# Patient Record
Sex: Male | Born: 1940 | Race: White | Hispanic: No | Marital: Married | Smoking: Former smoker
Health system: Southern US, Community
[De-identification: ages and names within clinical notes are randomized; demographics above are authoritative.]

## PROBLEM LIST (undated history)

## (undated) DIAGNOSIS — I714 Abdominal aortic aneurysm, without rupture, unspecified: Secondary | ICD-10-CM

## (undated) DIAGNOSIS — F418 Other specified anxiety disorders: Secondary | ICD-10-CM

## (undated) DIAGNOSIS — F32A Depression, unspecified: Secondary | ICD-10-CM

## (undated) DIAGNOSIS — C801 Malignant (primary) neoplasm, unspecified: Secondary | ICD-10-CM

## (undated) DIAGNOSIS — M1990 Unspecified osteoarthritis, unspecified site: Secondary | ICD-10-CM

## (undated) DIAGNOSIS — J40 Bronchitis, not specified as acute or chronic: Secondary | ICD-10-CM

## (undated) DIAGNOSIS — R7303 Prediabetes: Secondary | ICD-10-CM

## (undated) DIAGNOSIS — J449 Chronic obstructive pulmonary disease, unspecified: Secondary | ICD-10-CM

## (undated) DIAGNOSIS — K219 Gastro-esophageal reflux disease without esophagitis: Secondary | ICD-10-CM

## (undated) DIAGNOSIS — I509 Heart failure, unspecified: Secondary | ICD-10-CM

## (undated) DIAGNOSIS — IMO0002 Reserved for concepts with insufficient information to code with codable children: Secondary | ICD-10-CM

## (undated) DIAGNOSIS — E785 Hyperlipidemia, unspecified: Secondary | ICD-10-CM

## (undated) DIAGNOSIS — I729 Aneurysm of unspecified site: Secondary | ICD-10-CM

## (undated) DIAGNOSIS — F329 Major depressive disorder, single episode, unspecified: Secondary | ICD-10-CM

## (undated) DIAGNOSIS — I10 Essential (primary) hypertension: Secondary | ICD-10-CM

## (undated) DIAGNOSIS — K449 Diaphragmatic hernia without obstruction or gangrene: Secondary | ICD-10-CM

## (undated) DIAGNOSIS — R609 Edema, unspecified: Secondary | ICD-10-CM

## (undated) DIAGNOSIS — N400 Enlarged prostate without lower urinary tract symptoms: Secondary | ICD-10-CM

## (undated) HISTORY — DX: Benign prostatic hyperplasia without lower urinary tract symptoms: N40.0

## (undated) HISTORY — DX: Prediabetes: R73.03

## (undated) HISTORY — DX: Major depressive disorder, single episode, unspecified: F32.9

## (undated) HISTORY — DX: Chronic obstructive pulmonary disease, unspecified: J44.9

## (undated) HISTORY — PX: CORONARY STENT PLACEMENT: SHX1402

## (undated) HISTORY — DX: Depression, unspecified: F32.A

## (undated) HISTORY — DX: Gastro-esophageal reflux disease without esophagitis: K21.9

## (undated) HISTORY — DX: Aneurysm of unspecified site: I72.9

## (undated) HISTORY — DX: Essential (primary) hypertension: I10

## (undated) HISTORY — DX: Unspecified osteoarthritis, unspecified site: M19.90

## (undated) HISTORY — DX: Reserved for concepts with insufficient information to code with codable children: IMO0002

## (undated) HISTORY — DX: Bronchitis, not specified as acute or chronic: J40

## (undated) HISTORY — PX: KNEE ARTHROSCOPY: SUR90

## (undated) HISTORY — DX: Edema, unspecified: R60.9

## (undated) HISTORY — DX: Heart failure, unspecified: I50.9

## (undated) HISTORY — PX: OTHER SURGICAL HISTORY: SHX169

## (undated) HISTORY — DX: Abdominal aortic aneurysm, without rupture, unspecified: I71.40

## (undated) HISTORY — DX: Hyperlipidemia, unspecified: E78.5

## (undated) HISTORY — PX: CYST EXCISION: SHX5701

## (undated) HISTORY — DX: Abdominal aortic aneurysm, without rupture: I71.4

## (undated) HISTORY — DX: Diaphragmatic hernia without obstruction or gangrene: K44.9

## (undated) HISTORY — DX: Other specified anxiety disorders: F41.8

## (undated) HISTORY — DX: Malignant (primary) neoplasm, unspecified: C80.1

---

## 1998-03-23 ENCOUNTER — Ambulatory Visit (HOSPITAL_COMMUNITY): Admission: RE | Admit: 1998-03-23 | Discharge: 1998-03-23 | Payer: Self-pay | Admitting: Vascular Surgery

## 1998-06-20 ENCOUNTER — Ambulatory Visit (HOSPITAL_COMMUNITY)
Admission: RE | Admit: 1998-06-20 | Discharge: 1998-06-20 | Payer: Self-pay | Admitting: Thoracic Surgery (Cardiothoracic Vascular Surgery)

## 1998-06-20 ENCOUNTER — Encounter: Payer: Self-pay | Admitting: Thoracic Surgery (Cardiothoracic Vascular Surgery)

## 1999-04-24 ENCOUNTER — Encounter: Payer: Self-pay | Admitting: Vascular Surgery

## 1999-04-24 ENCOUNTER — Encounter: Admission: RE | Admit: 1999-04-24 | Discharge: 1999-04-24 | Payer: Self-pay | Admitting: Vascular Surgery

## 1999-08-16 ENCOUNTER — Encounter: Payer: Self-pay | Admitting: Internal Medicine

## 1999-08-16 ENCOUNTER — Ambulatory Visit (HOSPITAL_COMMUNITY): Admission: RE | Admit: 1999-08-16 | Discharge: 1999-08-16 | Payer: Self-pay | Admitting: Internal Medicine

## 2002-01-23 ENCOUNTER — Ambulatory Visit (HOSPITAL_COMMUNITY): Admission: RE | Admit: 2002-01-23 | Discharge: 2002-01-23 | Payer: Self-pay | Admitting: Gastroenterology

## 2002-12-31 ENCOUNTER — Ambulatory Visit (HOSPITAL_COMMUNITY): Admission: RE | Admit: 2002-12-31 | Discharge: 2002-12-31 | Payer: Self-pay | Admitting: Internal Medicine

## 2002-12-31 ENCOUNTER — Encounter: Payer: Self-pay | Admitting: Internal Medicine

## 2005-01-08 ENCOUNTER — Ambulatory Visit (HOSPITAL_COMMUNITY): Admission: RE | Admit: 2005-01-08 | Discharge: 2005-01-08 | Payer: Self-pay | Admitting: Internal Medicine

## 2005-10-10 ENCOUNTER — Encounter: Payer: Self-pay | Admitting: Vascular Surgery

## 2007-03-14 ENCOUNTER — Ambulatory Visit: Payer: Self-pay | Admitting: Vascular Surgery

## 2007-11-14 ENCOUNTER — Ambulatory Visit: Payer: Self-pay | Admitting: Vascular Surgery

## 2008-05-21 ENCOUNTER — Ambulatory Visit: Payer: Self-pay | Admitting: Vascular Surgery

## 2008-11-19 ENCOUNTER — Ambulatory Visit: Payer: Self-pay | Admitting: Vascular Surgery

## 2009-02-18 ENCOUNTER — Encounter: Admission: RE | Admit: 2009-02-18 | Discharge: 2009-02-18 | Payer: Self-pay | Admitting: Vascular Surgery

## 2009-02-18 ENCOUNTER — Ambulatory Visit: Payer: Self-pay | Admitting: Vascular Surgery

## 2010-04-12 ENCOUNTER — Ambulatory Visit: Payer: Self-pay | Admitting: Vascular Surgery

## 2010-10-17 ENCOUNTER — Ambulatory Visit: Payer: Self-pay | Admitting: Vascular Surgery

## 2010-10-24 ENCOUNTER — Ambulatory Visit: Payer: Self-pay | Admitting: Vascular Surgery

## 2010-10-24 NOTE — Procedures (Signed)
DUPLEX ULTRASOUND OF ABDOMINAL AORTA   INDICATION:  Followup abdominal aortic aneurysm.   HISTORY:  Diabetes:  No.  Cardiac:  No.  Hypertension:  Yes.  Smoking:  No.  Connective Tissue Disorder:  Family History:  Sister had AAA that ruptured.  Previous Surgery:  Intestinal surgery in February of 2008 for bowel  obstruction.   DUPLEX EXAM:         AP (cm)                   TRANSVERSE (cm)  Proximal             2.8 cm                    2.9 cm  Mid                  3.9 cm                    3.9 cm  Distal               4.9 cm                    4.7 cm  Right Iliac          1.2 cm                    1.2 cm  Left Iliac           1.1 cm                    1.1 cm   PREVIOUS:  Date:  03/14/2007  AP:  4.3  TRANSVERSE:  4.6   IMPRESSION:  Known aneurysm of the mid to distal abdominal aorta with no  significant change in the maximum diameter measurements noted when  compared to a previous exam.   ___________________________________________  Larina Earthly, M.D.   CH/MEDQ  D:  11/14/2007  T:  11/14/2007  Job:  [id number]

## 2010-10-24 NOTE — Procedures (Signed)
DUPLEX ULTRASOUND OF ABDOMINAL AORTA   INDICATION:  Follow-up evaluation of known abdominal aortic aneurysm.   HISTORY:  Diabetes:  No.  Cardiac:  No.  Hypertension:  Yes.  Smoking:  No.  Connective Tissue Disorder:  Family History:  Sister had an abdominal aortic aneurysm which ruptured.  Previous Surgery:  Intestinal surgery in February, 2008 for bowel  obstruction.   DUPLEX EXAM:         AP (cm)                   TRANSVERSE (cm)  Proximal             2.8 Cm                    3.6 cm  Mid                  4.3 cm                    4.6 cm  Distal               2.2 cm                    2.5 cm  Right Iliac          1.0. Cm                   1.0 cm  Left Iliac           0.9 cm                    1.2 cm   PREVIOUS:  Date: 02/18/2006  AP:  4.24  TRANSVERSE:  4.45   IMPRESSION:  Abdominal aortic aneurysm measurements are stable from  previous study.   ___________________________________________  Larina Earthly, M.D.   MC/MEDQ  D:  03/14/2007  T:  03/14/2007  Job:  [id number]

## 2010-10-24 NOTE — Assessment & Plan Note (Signed)
OFFICE VISIT   ERMAL, HABERER  DOB:  Jul 14, 1940                                       11/19/2008  [id number]#:[id number]   The patient presents today for continued followup of his infrarenal  abdominal aortic aneurysm.  This has been known for nearly 20 years and  has had continued small progression over time.  He has no symptoms  referable to his aneurysm.  His sister did suffer a ruptured aneurysm  which she survived emergent repair.  He has some mild diffuse abdominal  soreness unrelated to his aneurysm.  He does have COPD with no cardiac  history.   On physical exam he continues to have 2+ femoral, popliteal and  posterior tibial pulses.  Abdominal exam reveals moderate obesity and I  do not feel any aneurysm.   Ultrasound today shows slight increase in size up to 5.04 cm up from  4.62 on his prior study.  I did note that the study prior to that had  estimated the size at 4.9 so he has not had a dramatic change.  I again  had a very long discussion with the patient and his wife present and  explained that he is at the threshold for repair.  He wished to consider  this in September.  We will see him back with a CT angio to determine if  he is a stent graft candidate versus an open candidate.  We will see him  for further discussion at that time.   Larina Earthly, M.D.  Electronically Signed   TFE/MEDQ  D:  11/19/2008  T:  11/19/2008  Job:  [id number]   cc:   Jeoffrey Massed, MD

## 2010-10-24 NOTE — Assessment & Plan Note (Signed)
OFFICE VISIT   Walter Snyder, Walter Snyder  DOB:  [birth year]                                       05/21/2008  MVHQI#:[id number]   The patient presents today for continued follow-up of his abdominal  aortic aneurysm.  He has had this followed up for nearly 20 years.  He  has had continued slow growth since that time.  He has had no symptoms  referable to his aneurysm and has had no new major medical difficulties.  History with elevated cholesterol and elevated blood pressure.  He does  have a history COPD; fortunately, he has not smoked since 1994.  His  weight is currently 215 pounds.  He is 6 feet 1 inch tall.   REVIEW OF SYSTEMS:  Positive for bronchitis, wheezing, hiatal hernia,  reflux, arthritis, depression, nervousness.   ALLERGIES:  None.   MEDICATIONS:  Lisinopril, Xanax, doxazosin, Spiriva, Zantac and  multivitamins.   PHYSICAL EXAMINATION:  A well-developed, well-nourished white male  appearing his stated age 70.  Vital Signs:  Blood pressure is 116/71,  pulse 72, respirations 18.  Abdominal:  Reveals mild obesity.  I do not  feel an aneurysm.  He has 2+ femoral, 2+ popliteal and 2+ posterior  tibial pulses with no evidence of peripheral aneurysms.   He underwent repeat ultrasound today and this reveals no significant  change since his last study 6 months ago, with a maximal diameter of 4.6  cm.  He will continue his usual activities and we will continue to  follow him in our vascular lab at 44-month intervals to rule out any  enlargement.  I did explain that if he reaches a size greater than 5 cm  we would proceed with CT imaging to determine if he is a candidate for  stent graft vs. open repair.  We will see him again in 6 months.   Larina Earthly, M.D.  Electronically Signed   TFE/MEDQ  D:  05/21/2008  T:  05/24/2008  Job:  [id number]   cc:   Lucky Cowboy, M.D.

## 2010-10-24 NOTE — Procedures (Signed)
DUPLEX ULTRASOUND OF ABDOMINAL AORTA   INDICATION:  Follow up AAA.   HISTORY:  Diabetes:  No.  Cardiac:  No.  Hypertension:  Yes.  Smoking:  No.  Connective Tissue Disorder:  Family History:  No.  Previous Surgery:  No.   DUPLEX EXAM:         AP (cm)                   TRANSVERSE (cm)  Proximal             2.97 cm                   3.31 cm  Mid                  4.54 cm                   4.27 cm  Distal               4.57 cm                   5.05 cm  Right Iliac  Left Iliac   PREVIOUS:  Date: 11/19/2008  AP:  4.74  TRANSVERSE:  5.04   IMPRESSION:  1. Abdominal aortic aneurysm noted with largest measurement of 4.57 X      5.05 cm.  2. Unable to visualize iliac arteries due to bowel gas.   ___________________________________________  Larina Earthly, M.D.   EM/MEDQ  D:  04/12/2010  T:  04/12/2010  Job:  [id number]

## 2010-10-24 NOTE — Assessment & Plan Note (Signed)
OFFICE VISIT   Walter Snyder, Walter Snyder  DOB:  Oct 26, 1940                                       02/18/2009  ZOXWR#:[id number]   The patient presents today for continued follow-up of his infrarenal  abdominal aortic aneurysm.  On my last visit with him in June, he had a  slight increase of his aneurysm from ultrasound up to 5 cm.  I had  recommended that we see him today with a CT scan to determine if he is a  stent graft candidate and also for sizing of his aorta.  He has no new  symptoms referable to his aneurysm and has been stable from a cardiac  point.  I reviewed his CT scan with him.  This shows diffuse  arteriomegaly beginning in his chest and extending down through the  aorta.  Maximal diameter is approximately 5 cm, however his aortic level  of the renal arteries is about 4 cm.  I had a long discussion with the  patient and his wife, explaining that I would recommend continued  observation.  I explained that with his diffuse arteriomegaly I feel  that his risk for rupture is less than someone with a fusiform aneurysm  and a small native aorta.  We will see him in 6 months with repeat  ultrasound.  I again reviewed symptoms of leaking aneurysm with him and  he will notify us should this occur.  Otherwise, we will see him in 6  months with continued serial ultrasound follow-up.   Larina Earthly, M.D.  Electronically Signed   TFE/MEDQ  D:  02/18/2009  T:  02/21/2009  Job:  [id number]   cc:   Lucky Cowboy, M.D.

## 2010-10-24 NOTE — Procedures (Signed)
DUPLEX ULTRASOUND OF ABDOMINAL AORTA   INDICATION:  Follow up of known AAA.   HISTORY:  Diabetes:  No.  Cardiac:  No.  Hypertension:  Yes.  Smoking:  No.  Connective Tissue Disorder:  Family History:  No.  Previous Surgery:   DUPLEX EXAM:         AP (cm)                   TRANSVERSE (cm)  Proximal             3.66 cm                   3.95 cm  Mid                  3.91 cm                   4.05 cm  Distal               4.62 cm                   4.04 cm  Right Iliac          1.36 cm                   1.30 cm  Left Iliac           1.13 cm                   1.08 cm   PREVIOUS:  Date:  AP:  4.9  TRANSVERSE:  4.7   IMPRESSION:  Duplex scan shows no significant change since the last  study on 11/14/2007.   ___________________________________________  Larina Earthly, M.D.   AC/MEDQ  D:  05/21/2008  T:  05/21/2008  Job:  [id number]

## 2010-10-24 NOTE — Procedures (Signed)
DUPLEX ULTRASOUND OF ABDOMINAL AORTA   INDICATION:  Follow-up evaluation of known abdominal aortic aneurysm.   HISTORY:  Diabetes:  No.  Cardiac:  No.  Hypertension:  Yes.  Smoking:  No.  Connective Tissue Disorder:  Family History:  No.  Previous Surgery:  No.   DUPLEX EXAM:         AP (cm)                   TRANSVERSE (cm)  Proximal             3.88 cm                   4.09 cm  Mid                  3.92 cm                   4.31 cm  Distal               4.74 cm                   5.04 cm  Right Iliac          1.26 cm                   1.22 cm  Left Iliac           1.20 cm                   1.13 cm   PREVIOUS:  Date:  AP:  4.62  TRANSVERSE:  4.04   IMPRESSION:  Slight increase in abdominal aortic aneurysm since last  study done on 05/21/08.   ___________________________________________  Larina Earthly, M.D.   AC/MEDQ  D:  11/19/2008  T:  11/19/2008  Job:  [id number]

## 2010-11-14 ENCOUNTER — Encounter (INDEPENDENT_AMBULATORY_CARE_PROVIDER_SITE_OTHER): Payer: MEDICARE

## 2010-11-14 ENCOUNTER — Ambulatory Visit (INDEPENDENT_AMBULATORY_CARE_PROVIDER_SITE_OTHER): Payer: MEDICARE | Admitting: Vascular Surgery

## 2010-11-14 DIAGNOSIS — I714 Abdominal aortic aneurysm, without rupture, unspecified: Secondary | ICD-10-CM

## 2010-11-15 NOTE — Assessment & Plan Note (Signed)
OFFICE VISIT  Walter Snyder, Walter Snyder DOB:  [birth year]                                       11/14/2010 [id number]#:[id number]  The patient presents today for discussion regarding his infrarenal abdominal aortic aneurysm.  He has no symptoms referable to this.  He has maintained stable health, did have arthroscopic surgery on his left knee, which was successful, since my last visit with him.  He does have a history of COPD.  No cardiac difficulties.  He is retired.  He quit smoking in 1984.  He does have history of GE reflux.  PHYSICAL EXAMINATION:  Well-developed, well-nourished white male appearing stated age in no acute stress.  Blood pressure 120/75, pulse 81, respirations 12.  HEENT:  Normal.  Abdomen:  Soft, nontender.  He does have prominent aortic pulsation with no tenderness.  He does have prominent femoral, popliteal and posterior tibial pulses with no evidence of peripheral aneurysm.  Neurologically, he is grossly intact.  He did undergo ultrasound today showing slight enlargement of from of 5.1 to 5.2 cm since his last ultrasound 6 months ago.  I again reviewed this with the patient and recommended we continue to see him at 5-month intervals with ultrasound.  He does have diffuse arteriomegaly.  He does understand symptoms of leaking aneurysm which I reviewed with him again today and he knows to report immediately to the Emergency Department should this occur.    Larina Earthly, M.D. Electronically Signed  TFE/MEDQ  D:  11/14/2010  T:  11/15/2010  Job:  [id number]  cc:   Kirk Ruths, M.D.

## 2010-11-23 NOTE — Procedures (Unsigned)
DUPLEX ULTRASOUND OF ABDOMINAL AORTA  INDICATION:  Followup abdominal aortic aneurysm.  HISTORY: Diabetes:  No. Cardiac:  No. Hypertension:  Yes. Smoking:  Previous. Connective Tissue Disorder: Family History:  No. Previous Surgery:  No.  DUPLEX EXAM:         AP (cm)                   TRANSVERSE (cm) Proximal             3.5 cm                    4.1 cm Mid                  4.7 cm                    4.9 cm Distal               5.2 cm                    4.7 cm Right Iliac          1.5 cm                    1.8 cm Left Iliac           1.5 cm                    2.0 cm  PREVIOUS:  Date:  AP:  4.6  TRANSVERSE:  5.1  IMPRESSION:  Stable appearing abdominal aortic aneurysm within the mid distal aorta with intraluminal thrombus and moderate atherosclerosis visualized.  The larger measurement today is 5.2 x 4.9 cm.  Iliacs appear stable with moderate atherosclerosis and mild ectasia. Technically difficult study due to patient body habitus and scar tissue from previous surgery.  ___________________________________________ Larina Earthly, M.D.  OD/MEDQ  D:  11/14/2010  T:  11/14/2010  Job:  [id number]

## 2011-04-30 ENCOUNTER — Encounter: Payer: Self-pay | Admitting: Vascular Surgery

## 2011-05-22 ENCOUNTER — Ambulatory Visit: Payer: MEDICARE | Admitting: Vascular Surgery

## 2011-05-22 ENCOUNTER — Encounter (INDEPENDENT_AMBULATORY_CARE_PROVIDER_SITE_OTHER): Payer: MEDICARE | Admitting: *Deleted

## 2011-05-22 DIAGNOSIS — I714 Abdominal aortic aneurysm, without rupture: Secondary | ICD-10-CM

## 2011-06-07 ENCOUNTER — Other Ambulatory Visit: Payer: Self-pay

## 2011-06-07 DIAGNOSIS — I714 Abdominal aortic aneurysm, without rupture: Secondary | ICD-10-CM

## 2011-06-07 NOTE — Procedures (Unsigned)
DUPLEX ULTRASOUND OF ABDOMINAL AORTA  INDICATION:  Abdominal aortic aneurysm.  HISTORY: Diabetes:  No. Cardiac:  No. Hypertension:  Yes. Smoking:  Previous. Connective Tissue Disorder: Family History:  Sister had abdominal aortic aneurysm. Previous Surgery:  No.  DUPLEX EXAM:         AP (cm)                   TRANSVERSE (cm) Proximal             3.8 cm                    3.9 cm Mid                  4.2 cm                    4.0 cm Distal               5.4 cm                    5.2 cm Right Iliac          1.1 cm                    1.3 cm Left Iliac           1.1 cm                    1.3 cm  PREVIOUS:  Date: 11/14/2010  AP:  5.2  TRANSVERSE:  4.7  IMPRESSION: 1. Aneurysmal dilatation of the abdominal aorta with no significant     increase in maximum diameter when compared to the previous     examination. 2. Limited visualization of the abdominal aorta due to acoustic     shadowing and patient body habitus.  ___________________________________________ Larina Earthly, M.D.  CH/MEDQ  D:  05/22/2011  T:  05/22/2011  Job:  [id number]

## 2011-06-08 ENCOUNTER — Encounter: Payer: Self-pay | Admitting: Vascular Surgery

## 2011-11-26 ENCOUNTER — Encounter: Payer: Self-pay | Admitting: Neurosurgery

## 2011-11-27 ENCOUNTER — Encounter: Payer: Self-pay | Admitting: Neurosurgery

## 2011-11-27 ENCOUNTER — Ambulatory Visit (INDEPENDENT_AMBULATORY_CARE_PROVIDER_SITE_OTHER): Payer: 59 | Admitting: Neurosurgery

## 2011-11-27 ENCOUNTER — Encounter (INDEPENDENT_AMBULATORY_CARE_PROVIDER_SITE_OTHER): Payer: MEDICARE

## 2011-11-27 VITALS — BP 116/78 | HR 65 | Resp 18 | Ht 73.0 in | Wt 209.1 lb

## 2011-11-27 DIAGNOSIS — I714 Abdominal aortic aneurysm, without rupture: Secondary | ICD-10-CM

## 2011-11-27 NOTE — Progress Notes (Signed)
[facility] [place] HISTORY AND PHYSICAL   CC: Annual carotid duplex for known AAA Referring Physician: Early  History of Present Illness: 71 year old male patient of Dr. Arbie Cookey followed for a known AAA. The patient denies any abdominal or back pain. Patient has no lower extremity pain or claudication symptoms. The patient denies any new medical diagnoses or recent surgeries.  Past Medical History  Diagnosis Date  . COPD (chronic obstructive pulmonary disease)   . GERD (gastroesophageal reflux disease)   . Arthritis   . Hyperlipidemia   . Hypertension   . Bronchitis   . Hiatal hernia   . Depression with anxiety   . AAA (abdominal aortic aneurysm)   . Asthma   . DDD (degenerative disc disease)     ROS: [x]  Positive   [ ]  Denies    General: [ ]  Weight loss, [ ]  Fever, [ ]  chills Neurologic: [ ]  Dizziness, [ ]  Blackouts, [ ]  Seizure [ ]  Stroke, [ ]  "Mini stroke", [ ]  Slurred speech, [ ]  Temporary blindness; [ ]  weakness in arms or legs, [ ]  Hoarseness Cardiac: [ ]  Chest pain/pressure, [ ]  Shortness of breath at rest [ ]  Shortness of breath with exertion, [ ]  Atrial fibrillation or irregular heartbeat Vascular: [ ]  Pain in legs with walking, [ ]  Pain in legs at rest, [ ]  Pain in legs at night,  [ ]  Non-healing ulcer, [ ]  Blood clot in vein/DVT,   Pulmonary: [ ]  Home oxygen, [ ]  Productive cough, [ ]  Coughing up blood, [ ]  Asthma,  [ ]  Wheezing Musculoskeletal:  [ ]  Arthritis, [ ]  Low back pain, [ ]  Joint pain Hematologic: [ ]  Easy Bruising, [ ]  Anemia; [ ]  Hepatitis Gastrointestinal: [ ]  Blood in stool, [ ]  Gastroesophageal Reflux/heartburn, [ ]  Trouble swallowing Urinary: [ ]  chronic Kidney disease, [ ]  on HD - [ ]  MWF or [ ]  TTHS, [ ]  Burning with urination, [ ]  Difficulty urinating Skin: [ ]  Rashes, [ ]  Wounds Psychological: [ ]  Anxiety, [ ]  Depression   Social History History  Substance Use Topics  . Smoking status: Former Smoker    Types: Cigarettes      Quit date: 06/11/1982  . Smokeless tobacco: Not on file  . Alcohol Use: No    Family History Family History  Problem Relation Age of Onset  . Other Sister     AAA    No Known Allergies  Current Outpatient Prescriptions  Medication Sig Dispense Refill  . ALPRAZolam (XANAX) 1 MG tablet Take 1 mg by mouth at bedtime as needed.        . citalopram (CELEXA) 20 MG tablet Take 20 mg by mouth daily.        Marland Kitchen lisinopril-hydrochlorothiazide (PRINZIDE,ZESTORETIC) 20-12.5 MG per tablet Take 1 tablet by mouth daily.        . Multiple Vitamin (MULTIVITAMIN) capsule Take 1 capsule by mouth daily.        . Ranitidine HCl (ZANTAC PO) Take by mouth as needed.        . tiotropium (SPIRIVA) 18 MCG inhalation capsule Place 18 mcg into inhaler and inhale daily.          Physical Examination  Filed Vitals:   11/27/11 1044  BP: 116/78  Pulse:   Resp:     Body mass index is 27.59 kg/(m^2).  General:  WDWN in NAD Gait: Normal HEENT: WNL Eyes: Pupils equal Pulmonary: normal non-labored breathing , without Rales, rhonchi,  wheezing Cardiac: RRR, without  Murmurs, rubs or gallops; No carotid bruits Abdomen: soft, NT, no masses Skin: no rashes, ulcers noted Vascular Exam/Pulses: Palpable abdominal mass, femoral palpable pulses are palpable  Extremities without ischemic changes, no Gangrene , no cellulitis; no open wounds;  Musculoskeletal: no muscle wasting or atrophy  Neurologic: A&O X 3; Appropriate Affect ; SENSATION: normal; MOTOR FUNCTION:  moving all extremities equally. Speech is fluent/normal  Non-Invasive Vascular Imaging: AAA sac size today is 5.35 which is a very slight increase from previous however visualization is difficult do to body habitus and ventral hernia.  ASSESSMENT/PLAN: Asymptomatic known AAA , I discussed the measurements today with Dr. Arbie Cookey who states the patient should have a followup CT of the chest abdomen and pelvis in 6 months and be seen in his  clinic.  Lauree Chandler ANP  Clinic M.D.: Early

## 2011-11-29 NOTE — Procedures (Unsigned)
DUPLEX ULTRASOUND OF ABDOMINAL AORTA  INDICATION:  Evaluation of known abdominal aortic aneurysm.  HISTORY: Diabetes:  No. Cardiac:  No. Hypertension:  Yes. Smoking:  Previous. Connective Tissue Disorder: Family History:  Sister with ruptured abdominal aortic aneurysm. Previous Surgery:  DUPLEX EXAM:         AP (cm)                   TRANSVERSE (cm) Proximal             4.0 cm                    3.31 cm Mid                  3.97 cm                   4.01 cm Distal               5.06 cm                   5.35 cm Right Iliac          1.35 cm                   1.4 cm Left Iliac           1.34 cm                   1.34 cm  PREVIOUS:  Date: 05/22/1011  AP:  5.4  TRANSVERSE:  5.2  IMPRESSION: 1. Abdominal aortic aneurysm measuring 5.06 cm AP x 5.35 cm     transverse.  This represents stable measurements when compared with     the previous exam performed on 05/22/2011. 2. Please note that this is a technically difficult exam secondary to     the patient's body habitus and the presence of a ventral hernia.  ___________________________________________ Larina Earthly, M.D.  CI/MEDQ  D:  11/27/2011  T:  11/27/2011  Job:  [id number]

## 2011-12-28 ENCOUNTER — Encounter: Payer: Self-pay | Admitting: Gastroenterology

## 2012-02-04 ENCOUNTER — Other Ambulatory Visit: Payer: Self-pay | Admitting: *Deleted

## 2012-02-04 DIAGNOSIS — I716 Thoracoabdominal aortic aneurysm, without rupture: Secondary | ICD-10-CM

## 2012-02-18 ENCOUNTER — Other Ambulatory Visit: Payer: Self-pay | Admitting: *Deleted

## 2012-02-21 ENCOUNTER — Other Ambulatory Visit: Payer: Self-pay | Admitting: *Deleted

## 2012-02-21 DIAGNOSIS — I716 Thoracoabdominal aortic aneurysm, without rupture: Secondary | ICD-10-CM

## 2012-05-22 ENCOUNTER — Other Ambulatory Visit: Payer: Self-pay | Admitting: Vascular Surgery

## 2012-05-26 ENCOUNTER — Encounter: Payer: Self-pay | Admitting: Vascular Surgery

## 2012-05-27 ENCOUNTER — Ambulatory Visit
Admission: RE | Admit: 2012-05-27 | Discharge: 2012-05-27 | Disposition: A | Discharge status: Home / Self Care | Payer: MEDICARE | Source: Ambulatory Visit | Attending: Vascular Surgery | Admitting: Vascular Surgery

## 2012-05-27 ENCOUNTER — Ambulatory Visit (INDEPENDENT_AMBULATORY_CARE_PROVIDER_SITE_OTHER): Payer: MEDICARE | Admitting: Vascular Surgery

## 2012-05-27 ENCOUNTER — Ambulatory Visit
Admission: RE | Admit: 2012-05-27 | Discharge: 2012-05-27 | Disposition: A | Discharge status: Home / Self Care | Payer: MEDICARE | Source: Ambulatory Visit | Attending: Neurosurgery | Admitting: Neurosurgery

## 2012-05-27 ENCOUNTER — Encounter: Payer: Self-pay | Admitting: Vascular Surgery

## 2012-05-27 VITALS — BP 142/74 | HR 68 | Resp 16 | Ht 73.0 in | Wt 209.0 lb

## 2012-05-27 DIAGNOSIS — I714 Abdominal aortic aneurysm, without rupture: Secondary | ICD-10-CM

## 2012-05-27 DIAGNOSIS — I716 Thoracoabdominal aortic aneurysm, without rupture: Secondary | ICD-10-CM

## 2012-05-27 MED ORDER — IOHEXOL 350 MG/ML SOLN
100.0000 mL | Freq: Once | INTRAVENOUS | Status: AC | PRN
  Administered 2012-05-27: 100 mL via INTRAVENOUS

## 2012-05-27 NOTE — Progress Notes (Signed)
The patient has today for continued followup of his known infrarenal abdominal aortic aneurysm. His most recent ultrasound suggested some increase in size and he is here today at 6 month interval with a CT scan for further evaluation. He remained stable and has had no symptoms related to his aneurysm. He did have prior emergent abdominal surgery in the past for bowel obstruction and does have a ventral hernia which is not incarcerated and not causing him any pain.  Past Medical History  Diagnosis Date  . COPD (chronic obstructive pulmonary disease)   . GERD (gastroesophageal reflux disease)   . Arthritis   . Hyperlipidemia   . Hypertension   . Bronchitis   . Hiatal hernia   . Depression with anxiety   . AAA (abdominal aortic aneurysm)   . Asthma   . DDD (degenerative disc disease)     History  Substance Use Topics  . Smoking status: Former Smoker    Types: Cigarettes    Quit date: 06/11/1982  . Smokeless tobacco: Never Used  . Alcohol Use: No    Family History  Problem Relation Age of Onset  . Other Sister     AAA    No Known Allergies  Current outpatient prescriptions:ALPRAZolam (XANAX) 1 MG tablet, Take 1 mg by mouth at bedtime as needed.  , Disp: , Rfl: ;  citalopram (CELEXA) 20 MG tablet, Take 20 mg by mouth daily.  , Disp: , Rfl: ;  doxazosin (CARDURA) 8 MG tablet, Take 1 tablet by mouth daily. , Disp: , Rfl: ;  lisinopril-hydrochlorothiazide (PRINZIDE,ZESTORETIC) 20-25 MG per tablet, Take 1 tablet by mouth daily., Disp: , Rfl:  Multiple Vitamin (MULTIVITAMIN) capsule, Take 1 capsule by mouth daily.  , Disp: , Rfl: ;  Ranitidine HCl (ZANTAC PO), Take by mouth as needed.  , Disp: , Rfl: ;  tiotropium (SPIRIVA) 18 MCG inhalation capsule, Place 18 mcg into inhaler and inhale daily.  , Disp: , Rfl: ;  lisinopril-hydrochlorothiazide (PRINZIDE,ZESTORETIC) 20-12.5 MG per tablet, Take 1 tablet by mouth daily.  , Disp: , Rfl:   BP 142/74  Pulse 68  Resp 16  Ht 6\' 1"  (1.854 m)  Wt  209 lb (94.802 kg)  BMI 27.57 kg/m2  SpO2 96%  Body mass index is 27.57 kg/(m^2).       Physical exam well-developed well-nourished gentleman appearing stated age with no acute distress Carotid arteries without bruits bilaterally Heart regular rate and rhythm without murmur  Abdomen soft nontender no tenderness over his palpable aneurysm. Pulse status 2+ femoral pulses bilaterally Neurologically is grossly intact  CT scan today shows diffuse arteriomegaly. There is been no significant increase with maximal diameter of his aneurysm at approximately 5.1 cm. I discussed this with the patient and recommended continued 6 months interval evaluation with ultrasound. I again discussed symptoms of leaking aneurysm in his report he should this occur. Otherwise we'll see him in 6 months with repeat ultrasound evaluation

## 2012-05-28 NOTE — Addendum Note (Signed)
Addended by: Sharee Pimple on: 05/28/2012 09:13 AM   Modules accepted: Orders

## 2012-08-28 ENCOUNTER — Other Ambulatory Visit (HOSPITAL_COMMUNITY): Payer: Self-pay | Admitting: Internal Medicine

## 2012-08-28 ENCOUNTER — Ambulatory Visit (HOSPITAL_COMMUNITY)
Admission: RE | Admit: 2012-08-28 | Discharge: 2012-08-28 | Disposition: A | Discharge status: Home / Self Care | Payer: MEDICARE | Source: Ambulatory Visit | Attending: Internal Medicine | Admitting: Internal Medicine

## 2012-08-28 DIAGNOSIS — J4489 Other specified chronic obstructive pulmonary disease: Secondary | ICD-10-CM | POA: Insufficient documentation

## 2012-08-28 DIAGNOSIS — R0602 Shortness of breath: Secondary | ICD-10-CM | POA: Insufficient documentation

## 2012-08-28 DIAGNOSIS — I700 Atherosclerosis of aorta: Secondary | ICD-10-CM | POA: Insufficient documentation

## 2012-09-25 ENCOUNTER — Encounter: Payer: Self-pay | Admitting: Gastroenterology

## 2012-10-01 ENCOUNTER — Other Ambulatory Visit: Payer: MEDICARE

## 2012-10-01 ENCOUNTER — Other Ambulatory Visit: Payer: Self-pay | Admitting: Internal Medicine

## 2012-10-01 DIAGNOSIS — R0602 Shortness of breath: Secondary | ICD-10-CM

## 2012-10-01 DIAGNOSIS — R7989 Other specified abnormal findings of blood chemistry: Secondary | ICD-10-CM

## 2012-11-25 ENCOUNTER — Encounter (INDEPENDENT_AMBULATORY_CARE_PROVIDER_SITE_OTHER): Payer: MEDICARE | Admitting: *Deleted

## 2012-11-25 ENCOUNTER — Ambulatory Visit: Payer: MEDICARE | Admitting: Neurosurgery

## 2012-11-25 DIAGNOSIS — I714 Abdominal aortic aneurysm, without rupture: Secondary | ICD-10-CM

## 2012-11-27 ENCOUNTER — Other Ambulatory Visit: Payer: Self-pay

## 2012-11-27 DIAGNOSIS — I714 Abdominal aortic aneurysm, without rupture: Secondary | ICD-10-CM

## 2012-12-01 ENCOUNTER — Encounter: Payer: Self-pay | Admitting: Vascular Surgery

## 2013-05-22 DIAGNOSIS — C801 Malignant (primary) neoplasm, unspecified: Secondary | ICD-10-CM

## 2013-05-22 HISTORY — PX: OTHER SURGICAL HISTORY: SHX169

## 2013-05-22 HISTORY — DX: Malignant (primary) neoplasm, unspecified: C80.1

## 2013-05-26 ENCOUNTER — Encounter: Payer: Self-pay | Admitting: Internal Medicine

## 2013-05-26 DIAGNOSIS — E785 Hyperlipidemia, unspecified: Secondary | ICD-10-CM | POA: Insufficient documentation

## 2013-05-26 DIAGNOSIS — K219 Gastro-esophageal reflux disease without esophagitis: Secondary | ICD-10-CM | POA: Insufficient documentation

## 2013-05-26 DIAGNOSIS — J449 Chronic obstructive pulmonary disease, unspecified: Secondary | ICD-10-CM | POA: Insufficient documentation

## 2013-05-26 DIAGNOSIS — N400 Enlarged prostate without lower urinary tract symptoms: Secondary | ICD-10-CM | POA: Insufficient documentation

## 2013-05-26 DIAGNOSIS — I10 Essential (primary) hypertension: Secondary | ICD-10-CM | POA: Insufficient documentation

## 2013-05-26 DIAGNOSIS — R7303 Prediabetes: Secondary | ICD-10-CM | POA: Insufficient documentation

## 2013-05-26 DIAGNOSIS — F329 Major depressive disorder, single episode, unspecified: Secondary | ICD-10-CM | POA: Insufficient documentation

## 2013-05-26 DIAGNOSIS — F32A Depression, unspecified: Secondary | ICD-10-CM | POA: Insufficient documentation

## 2013-05-27 ENCOUNTER — Ambulatory Visit: Payer: Self-pay | Admitting: Physician Assistant

## 2013-06-01 ENCOUNTER — Encounter: Payer: Self-pay | Admitting: Family

## 2013-06-02 ENCOUNTER — Ambulatory Visit: Payer: MEDICARE | Admitting: Physician Assistant

## 2013-06-02 ENCOUNTER — Ambulatory Visit: Payer: MEDICARE | Admitting: Family

## 2013-06-02 ENCOUNTER — Encounter (HOSPITAL_COMMUNITY): Payer: MEDICARE

## 2013-06-02 ENCOUNTER — Other Ambulatory Visit (HOSPITAL_COMMUNITY): Payer: MEDICARE

## 2013-06-23 ENCOUNTER — Encounter: Payer: Self-pay | Admitting: Vascular Surgery

## 2013-06-24 ENCOUNTER — Ambulatory Visit (INDEPENDENT_AMBULATORY_CARE_PROVIDER_SITE_OTHER): Payer: MEDICARE | Admitting: Physician Assistant

## 2013-06-24 ENCOUNTER — Ambulatory Visit (HOSPITAL_COMMUNITY)
Admission: RE | Admit: 2013-06-24 | Discharge: 2013-06-24 | Disposition: A | Discharge status: Home / Self Care | Payer: MEDICARE | Source: Ambulatory Visit | Attending: Family | Admitting: Family

## 2013-06-24 ENCOUNTER — Ambulatory Visit (INDEPENDENT_AMBULATORY_CARE_PROVIDER_SITE_OTHER): Payer: MEDICARE | Admitting: Family

## 2013-06-24 ENCOUNTER — Encounter: Payer: Self-pay | Admitting: Physician Assistant

## 2013-06-24 ENCOUNTER — Encounter (INDEPENDENT_AMBULATORY_CARE_PROVIDER_SITE_OTHER): Payer: Self-pay

## 2013-06-24 ENCOUNTER — Encounter: Payer: Self-pay | Admitting: Family

## 2013-06-24 VITALS — BP 132/80 | HR 84 | Temp 97.9°F | Resp 16 | Ht 73.0 in | Wt 222.0 lb

## 2013-06-24 VITALS — BP 122/77 | HR 72 | Resp 16 | Ht 73.0 in | Wt 220.0 lb

## 2013-06-24 DIAGNOSIS — R059 Cough, unspecified: Secondary | ICD-10-CM

## 2013-06-24 DIAGNOSIS — I10 Essential (primary) hypertension: Secondary | ICD-10-CM

## 2013-06-24 DIAGNOSIS — I714 Abdominal aortic aneurysm, without rupture, unspecified: Secondary | ICD-10-CM

## 2013-06-24 DIAGNOSIS — E785 Hyperlipidemia, unspecified: Secondary | ICD-10-CM

## 2013-06-24 DIAGNOSIS — R7303 Prediabetes: Secondary | ICD-10-CM

## 2013-06-24 DIAGNOSIS — R05 Cough: Secondary | ICD-10-CM

## 2013-06-24 DIAGNOSIS — R7309 Other abnormal glucose: Secondary | ICD-10-CM

## 2013-06-24 DIAGNOSIS — Z79899 Other long term (current) drug therapy: Secondary | ICD-10-CM

## 2013-06-24 DIAGNOSIS — R058 Other specified cough: Secondary | ICD-10-CM

## 2013-06-24 LAB — BASIC METABOLIC PANEL WITH GFR
BUN: 18 mg/dL (ref 6–23)
CALCIUM: 8.4 mg/dL (ref 8.4–10.5)
CO2: 28 mEq/L (ref 19–32)
Chloride: 99 mEq/L (ref 96–112)
Creat: 0.98 mg/dL (ref 0.50–1.35)
GFR, EST AFRICAN AMERICAN: 88 mL/min
GFR, EST NON AFRICAN AMERICAN: 76 mL/min
Glucose, Bld: 89 mg/dL (ref 70–99)
POTASSIUM: 4.2 meq/L (ref 3.5–5.3)
SODIUM: 138 meq/L (ref 135–145)

## 2013-06-24 LAB — LIPID PANEL
CHOLESTEROL: 171 mg/dL (ref 0–200)
HDL: 34 mg/dL — AB (ref >39)
LDL CALC: 115 mg/dL — AB (ref 0–99)
TRIGLYCERIDES: 112 mg/dL (ref <150)
Total CHOL/HDL Ratio: 5 Ratio
VLDL: 22 mg/dL (ref 0–40)

## 2013-06-24 LAB — CBC WITH DIFFERENTIAL/PLATELET
BASOS ABS: 0 10*3/uL (ref 0.0–0.1)
BASOS PCT: 0 % (ref 0–1)
EOS ABS: 0.2 10*3/uL (ref 0.0–0.7)
Eosinophils Relative: 3 % (ref 0–5)
HCT: 39.4 % (ref 39.0–52.0)
HEMOGLOBIN: 13.5 g/dL (ref 13.0–17.0)
Lymphocytes Relative: 13 % (ref 12–46)
Lymphs Abs: 0.8 10*3/uL (ref 0.7–4.0)
MCH: 30.5 pg (ref 26.0–34.0)
MCHC: 34.3 g/dL (ref 30.0–36.0)
MCV: 89.1 fL (ref 78.0–100.0)
Monocytes Absolute: 1.1 10*3/uL — ABNORMAL HIGH (ref 0.1–1.0)
Monocytes Relative: 17 % — ABNORMAL HIGH (ref 3–12)
NEUTROS ABS: 4.1 10*3/uL (ref 1.7–7.7)
NEUTROS PCT: 67 % (ref 43–77)
PLATELETS: 213 10*3/uL (ref 150–400)
RBC: 4.42 MIL/uL (ref 4.22–5.81)
RDW: 14.2 % (ref 11.5–15.5)
WBC: 6.1 10*3/uL (ref 4.0–10.5)

## 2013-06-24 LAB — MAGNESIUM: MAGNESIUM: 1.8 mg/dL (ref 1.5–2.5)

## 2013-06-24 LAB — HEPATIC FUNCTION PANEL
ALT: 20 U/L (ref 0–53)
AST: 21 U/L (ref 0–37)
Albumin: 3.8 g/dL (ref 3.5–5.2)
Alkaline Phosphatase: 69 U/L (ref 39–117)
BILIRUBIN DIRECT: 0.1 mg/dL (ref 0.0–0.3)
BILIRUBIN INDIRECT: 0.5 mg/dL (ref 0.0–0.9)
Total Bilirubin: 0.6 mg/dL (ref 0.3–1.2)
Total Protein: 6.3 g/dL (ref 6.0–8.3)

## 2013-06-24 LAB — HEMOGLOBIN A1C
Hgb A1c MFr Bld: 6.4 % — ABNORMAL HIGH (ref <5.7)
MEAN PLASMA GLUCOSE: 137 mg/dL — AB (ref <117)

## 2013-06-24 MED ORDER — LEVOFLOXACIN 500 MG PO TABS: 500.0000 mg | ORAL_TABLET | Freq: Every day | ORAL | Status: AC

## 2013-06-24 NOTE — Patient Instructions (Signed)
 Bad carbs also include fruit juice, alcohol, and sweet tea. These are empty calories that do not signal to your brain that you are full.   Please remember the good carbs are still carbs which convert into sugar. So please measure them out no more than 1/2-1 cup of rice, oatmeal, pasta, and beans.  Veggies are however free foods! Pile them on.   I like lean protein at every meal such as chicken, turkey, pork chops, cottage cheese, etc. Just do not fry these meats and please center your meal around vegetable, the meats should be a side dish.   No all fruit is created equal. Please see the list below, the fruit at the bottom is higher in sugars than the fruit at the top   Cholesterol Cholesterol is a white, waxy, fat-like protein needed by your body in small amounts. The liver makes all the cholesterol you need. It is carried from the liver by the blood through the blood vessels. Deposits (plaque) may build up on blood vessel walls. This makes the arteries narrower and stiffer. Plaque increases the risk for heart attack and stroke. You cannot feel your cholesterol level even if it is very high. The only way to know is by a blood test to check your lipid (fats) levels. Once you know your cholesterol levels, you should keep a record of the test results. Work with your caregiver to to keep your levels in the desired range. WHAT THE RESULTS MEAN:  Total cholesterol is a rough measure of all the cholesterol in your blood.  LDL is the so-called bad cholesterol. This is the type that deposits cholesterol in the walls of the arteries. You want this level to be low.  HDL is the good cholesterol because it cleans the arteries and carries the LDL away. You want this level to be high.  Triglycerides are fat that the body can either burn for energy or store. High levels are closely linked to heart disease. DESIRED LEVELS:  Total cholesterol below 200.  LDL below 100 for people at risk, below 70 for very  high risk.  HDL above 50 is good, above 60 is best.  Triglycerides below 150. HOW TO LOWER YOUR CHOLESTEROL:  Diet.  Choose fish or white meat chicken and turkey, roasted or baked. Limit fatty cuts of red meat, fried foods, and processed meats, such as sausage and lunch meat.  Eat lots of fresh fruits and vegetables. Choose whole grains, beans, pasta, potatoes and cereals.  Use only small amounts of olive, corn or canola oils. Avoid butter, mayonnaise, shortening or palm kernel oils. Avoid foods with trans-fats.  Use skim/nonfat milk and low-fat/nonfat yogurt and cheeses. Avoid whole milk, cream, ice cream, egg yolks and cheeses. Healthy desserts include angel food cake, ginger snaps, animal crackers, hard candy, popsicles, and low-fat/nonfat frozen yogurt. Avoid pastries, cakes, pies and cookies.  Exercise.  A regular program helps decrease LDL and raises HDL.  Helps with weight control.  Do things that increase your activity level like gardening, walking, or taking the stairs.  Medication.  May be prescribed by your caregiver to help lowering cholesterol and the risk for heart disease.  You may need medicine even if your levels are normal if you have several risk factors. HOME CARE INSTRUCTIONS   Follow your diet and exercise programs as suggested by your caregiver.  Take medications as directed.  Have blood work done when your caregiver feels it is necessary. MAKE SURE YOU:   Understand   these instructions.  Will watch your condition.  Will get help right away if you are not doing well or get worse. Document Released: 02/20/2001 Document Revised: 08/20/2011 Document Reviewed: 03/11/2013 [facility] Patient Information 2014 [place], [place]. DASH Diet The DASH diet stands for "Dietary Approaches to Stop Hypertension." It is a healthy eating plan that has been shown to reduce high blood pressure (hypertension) in as little as 14 days, while also possibly providing other  significant health benefits. These other health benefits include reducing the risk of breast cancer after menopause and reducing the risk of type 2 diabetes, heart disease, colon cancer, and stroke. Health benefits also include weight loss and slowing kidney failure in patients with chronic kidney disease.  DIET GUIDELINES  Limit salt (sodium). Your diet should contain less than 1500 mg of sodium daily.  Limit refined or processed carbohydrates. Your diet should include mostly whole grains. Desserts and added sugars should be used sparingly.  Include small amounts of heart-healthy fats. These types of fats include nuts, oils, and tub margarine. Limit saturated and trans fats. These fats have been shown to be harmful in the body. CHOOSING FOODS  The following food groups are based on a 2000 calorie diet. See your Registered Dietitian for individual calorie needs. Grains and Grain Products (6 to 8 servings daily)  Eat More Often: Whole-wheat bread, brown rice, whole-grain or wheat pasta, quinoa, popcorn without added fat or salt (air popped).  Eat Less Often: White bread, white pasta, white rice, cornbread. Vegetables (4 to 5 servings daily)  Eat More Often: Fresh, frozen, and canned vegetables. Vegetables may be raw, steamed, roasted, or grilled with a minimal amount of fat.  Eat Less Often/Avoid: Creamed or fried vegetables. Vegetables in a cheese sauce. Fruit (4 to 5 servings daily)  Eat More Often: All fresh, canned (in natural juice), or frozen fruits. Dried fruits without added sugar. One hundred percent fruit juice ( cup [237 mL] daily).  Eat Less Often: Dried fruits with added sugar. Canned fruit in light or heavy syrup. [employer], Fish, and Poultry (2 servings or less daily. One serving is 3 to 4 oz [85-114 g]).  Eat More Often: Ninety percent or leaner ground beef, tenderloin, sirloin. Round cuts of beef, chicken breast, [place] breast. All fish. Grill, bake, or broil your meat.  Nothing should be fried.  Eat Less Often/Avoid: Fatty cuts of meat, [place], or chicken leg, thigh, or wing. Fried cuts of meat or fish. Dairy (2 to 3 servings)  Eat More Often: Low-fat or fat-free milk, low-fat plain or light yogurt, reduced-fat or part-skim cheese.  Eat Less Often/Avoid: Milk (whole, 2%).Whole milk yogurt. Full-fat cheeses. Nuts, Seeds, and Legumes (4 to 5 servings per week)  Eat More Often: All without added salt.  Eat Less Often/Avoid: Salted nuts and seeds, canned beans with added salt. Fats and Sweets (limited)  Eat More Often: Vegetable oils, tub margarines without trans fats, sugar-free gelatin. Mayonnaise and salad dressings.  Eat Less Often/Avoid: Coconut oils, palm oils, butter, stick margarine, cream, half and half, cookies, candy, pie. FOR MORE INFORMATION The Dash Diet Eating Plan: www.dashdiet.org Document Released: 05/17/2011 Document Revised: 08/20/2011 Document Reviewed: 05/17/2011 [facility] Patient Information 2014 [place], [place].

## 2013-06-24 NOTE — Patient Instructions (Signed)

## 2013-06-24 NOTE — Addendum Note (Signed)
Addended by: Thresa Ross C on: 06/24/2013 01:21 PM   Modules accepted: Orders

## 2013-06-24 NOTE — Progress Notes (Signed)
HPI Patient presents for 3 month follow up with hypertension, hyperlipidemia, prediabetes and vitamin D. Patient's blood pressure has been controlled at home, today their BP is BP: 132/80 mmHg  Patient denies chest pain, shortness of breath, dizziness.  Patient's cholesterol is diet controlled. The cholesterol last visit was LDL 147, HDL 35, trigs 180.  The patient has been working on diet and exercise for prediabetes, and denies changes in vision, polys, and paresthesias. A1C 5.7.  He states that he has been having cough/cold symptoms, he has had a zpak and steroids one week ago without help. He has been cough, fatigue. His wife had surgery 3 days ago and he has been very stressed. He has also recently seen his Vascular doctor for his AAA and states they are getting a CTA for possible surgery. He has been under a lot of stress.  Patient is on Vitamin D supplement.   Current Medications:  Current Outpatient Prescriptions on File Prior to Visit  Medication Sig Dispense Refill  . acetaminophen (TYLENOL) 100 MG/ML solution Take 10 mg/kg by mouth every 4 (four) hours as needed for fever.      . ALPRAZolam (XANAX) 1 MG tablet Take 1 mg by mouth at bedtime as needed.        . citalopram (CELEXA) 20 MG tablet Take 20 mg by mouth daily.        Marland Kitchen doxazosin (CARDURA) 8 MG tablet Take 1 tablet by mouth daily.       . fluticasone (VERAMYST) 27.5 MCG/SPRAY nasal spray Place 2 sprays into the nose daily.      Marland Kitchen lisinopril-hydrochlorothiazide (PRINZIDE,ZESTORETIC) 20-12.5 MG per tablet Take 1 tablet by mouth daily.        Marland Kitchen lisinopril-hydrochlorothiazide (PRINZIDE,ZESTORETIC) 20-25 MG per tablet Take 1 tablet by mouth daily.      Marland Kitchen loratadine-pseudoephedrine (CLARITIN-D 12-HOUR) 5-120 MG per tablet Take 1 tablet by mouth 2 (two) times daily.      . Multiple Vitamin (MULTIVITAMIN) capsule Take 1 capsule by mouth daily.        Marland Kitchen oxyCODONE-acetaminophen (PERCOCET/ROXICET) 5-325 MG per tablet Take by mouth as  needed for severe pain (One or two per week).      . pseudoephedrine (SUDAFED) 30 MG tablet Take 30 mg by mouth every 4 (four) hours as needed for congestion.      . Ranitidine HCl (ZANTAC PO) Take by mouth as needed.        . tamsulosin (FLOMAX) 0.4 MG CAPS capsule every morning.      . tiotropium (SPIRIVA) 18 MCG inhalation capsule Place 18 mcg into inhaler and inhale daily.         No current facility-administered medications on file prior to visit.   Medical History:  Past Medical History  Diagnosis Date  . Bronchitis   . Depression with anxiety   . AAA (abdominal aortic aneurysm)   . Asthma   . Prediabetes   . Hyperlipidemia   . Hypertension   . COPD (chronic obstructive pulmonary disease)   . GERD (gastroesophageal reflux disease)   . Hiatal hernia   . Arthritis   . DDD (degenerative disc disease)   . Depression   . BPH (benign prostatic hyperplasia)   . Aneurysm   . 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  ENT: + snoring  Denies congestion, post nasal drip, sore throat, earache, dental pain, Tinnitus, Vertigo, Sinus pain, snoring.  Cardio: Denies chest pain, palpitations, irregular heartbeat, dyspnea, diaphoresis, orthopnea, PND, claudication, edema Respiratory: + cough, wheezing denies shortness of breath Gastrointestinal: Denies dysphagia, heartburn, AB pain/ cramps, N/V, diarrhea, constipation, hematemesis, melena, hematochezia,  hemorrhoids Genitourinary: Denies dysuria, frequency, urgency, nocturia, hesitancy, discharge, hematuria, flank pain Musculoskeletal: Denies myalgia, stiffness, pain, swelling and strain/sprain. Skin: Denies pruritis, rash, changing in skin lesion Neuro: Denies Weakness, tremor, incoordination, spasms, pain Psychiatric: Denies confusion, memory  loss, sensory loss Endocrine: Denies change in weight, skin, hair change, nocturia Diabetic Polys, Denies visual blurring, hyper /hypo glycemic episodes, and paresthesia, Heme/Lymph: Denies Excessive bleeding, bruising, enlarged lymph nodes  Family history- Review and unchanged Social history- Review and unchanged Physical Exam: Filed Vitals:   06/24/13 1153  BP: 132/80  Pulse: 84  Temp: 97.9 F (36.6 C)  Resp: 16   Filed Weights   06/24/13 1153  Weight: 222 lb (100.699 kg)   General Appearance: Well nourished, in no apparent distress. Eyes: PERRLA, EOMs, conjunctiva no swelling or erythema Sinuses: No Frontal/maxillary tenderness ENT/Mouth: very crowded mouth  Ext aud canals clear, TMs without erythema, bulging. No erythema, swelling, or exudate on post pharynx.  Tonsils not swollen or erythematous. Hearing decreased.  Neck: Supple, thyroid normal.  Respiratory: Respiratory effort normal, + diffuse expiratory wheezing without rales, rhonchi, wheezing or stridor.  Cardio: RRR with no MRGs. Brisk peripheral pulses without edema.  Abdomen: Soft, obese, + BS. Mild epigastric tenderness, no guarding, rebound, hernias, masses. Lymphatics: Non tender without lymphadenopathy.  Musculoskeletal: Full ROM, 5/5 strength, normal gait.  Skin: Warm, dry without rashes, lesions, ecchymosis.  Neuro: Cranial nerves intact. Normal muscle tone, no cerebellar symptoms.  Psych: Awake and oriented X 3, normal affect, Insight and Judgment appropriate.   Assessment and Plan:  Hypertension: Continue medication, monitor blood pressure at home.  Continue DASH diet. Cholesterol: Continue diet and exercise. Check cholesterol.  Pre-diabetes-Continue diet and exercise. Check A1C Vitamin D Def- check level and continue medications.  Bronchitis/ AECOPD- levaquin, OTC meds, no decongestants, albuterol and continue spiriva and advair.  Likely sleep apnea- states he wants a study after his procedure.  AAA-  long discussion about AAA, about HTN, chol and sugars. If any back pain, AB pain call 911.   Continue diet and meds as discussed. Further disposition pending results of labs.  Vicie Mutters 12:09 PM

## 2013-06-24 NOTE — Progress Notes (Addendum)
[facility] [place]  Established Abdominal Aortic Aneurysm  History of Present Illness  Walter Snyder is a 73 y.o. (Jun 11, 1941) male patient of Dr. Donnetta Hutching returns today for continued followup of his known infrarenal abdominal aortic aneurysm. He did have prior emergent abdominal surgery in the past for bowel obstruction and does have a ventral hernia which is not incarcerated.  He has degenerative spine disease that causes intermittent thoracic area pain. He reports that he has COPD, has had an exacerbation and coughing for at least 2 weeks, is taking antibiotics and prednisone, will see his PCP today for this. He denies no increase in back pain other than chronic soreness in thoracic spine area, he also reports upper mid abdominal mild soreness since he has been coughing. Previous studies demonstrate an AAA, measuring 5.2 cm on 11/25/12.   The patient denies claudication in legs with walking. The patient denies history of stroke or TIA symptoms. He is scheduled to see Dr. Lyndee Hensen (sp?), cardiologist, as requested by his PCP to see if any of his dyspnea is cardiac related. Pt states that he had a CT angiogram a year or so ago and Dr. Donnetta Hutching told him he is not a candidate for an EVAR.  Pt Diabetic: No Pt smoker: former smoker, quit in 1984  Past Medical History  Diagnosis Date  . Bronchitis   . Depression with anxiety   . AAA (abdominal aortic aneurysm)   . Asthma   . Prediabetes   . Hyperlipidemia   . Hypertension   . COPD (chronic obstructive pulmonary disease)   . GERD (gastroesophageal reflux disease)   . Hiatal hernia   . Arthritis   . DDD (degenerative disc disease)   . Depression   . BPH (benign prostatic hyperplasia)   . Aneurysm    Past Surgical History  Procedure Laterality Date  . Knee arthroscopy      left  . Cyst excision      back  . Lens implant      bilateral   Social History History   Social History  . Marital Status: Married   Spouse Name: N/A    Number of Children: N/A  . Years of Education: N/A   Occupational History  . Not on file.   Social History Main Topics  . Smoking status: Former Smoker    Types: Cigarettes    Quit date: 06/11/1982  . Smokeless tobacco: Never Used  . Alcohol Use: No  . Drug Use: No  . Sexual Activity: Not on file   Other Topics Concern  . Not on file   Social History Narrative  . No narrative on file   Family History Family History  Problem Relation Age of Onset  . Other Sister     AAA    Current Outpatient Prescriptions on File Prior to Visit  Medication Sig Dispense Refill  . ALPRAZolam (XANAX) 1 MG tablet Take 1 mg by mouth at bedtime as needed.        . citalopram (CELEXA) 20 MG tablet Take 20 mg by mouth daily.        Marland Kitchen doxazosin (CARDURA) 8 MG tablet Take 1 tablet by mouth daily.       Marland Kitchen lisinopril-hydrochlorothiazide (PRINZIDE,ZESTORETIC) 20-12.5 MG per tablet Take 1 tablet by mouth daily.        Marland Kitchen lisinopril-hydrochlorothiazide (PRINZIDE,ZESTORETIC) 20-25 MG per tablet Take 1 tablet by mouth daily.      . Multiple Vitamin (MULTIVITAMIN) capsule Take 1 capsule  by mouth daily.        . Ranitidine HCl (ZANTAC PO) Take by mouth as needed.        . tiotropium (SPIRIVA) 18 MCG inhalation capsule Place 18 mcg into inhaler and inhale daily.         No current facility-administered medications on file prior to visit.   No Known Allergies  ROS: See HPI for pertinent positives and negatives.  Physical Examination  Filed Vitals:   06/24/13 1018  BP: 122/77  Pulse: 72  Resp: 16  SaO2 on RA is 92% Filed Weights   06/24/13 1018  Weight: 220 lb (99.791 kg)   Body mass index is 29.03 kg/(m^2).  General: A&O x 3, WD, overweight, central adiposity.  Pulmonary: Sym exp, decreased air movt throughout, expiratory wheezing in all fields anterior and posterior.  Cardiac: RRR, Nl S1, S2, no Murmurs, rubs or gallops appreciated.  Carotid Bruits Left Right   Negative  Negative   Aorta is not palpable Radial pulses are 2+ and equal.                          VASCULAR EXAM:                                                                                                         LE Pulses LEFT RIGHT       FEMORAL  not palpable, likely due to body habitus  not palpable, likely due to body habitus         POPLITEAL  not palpable   not palpable       POSTERIOR TIBIAL  not palpable    palpable        DORSALIS PEDIS      ANTERIOR TIBIAL  palpable   palpable     Gastrointestinal: soft, NTND, -G/R, - HSM, - masses, - CVAT B.  Musculoskeletal: M/S 5/5 throughout, Extremities without ischemic changes.   Neurologic: CN 2-12 intact except mild resting head tremor and mild hearing loss, Pain and light touch intact in extremities, Motor exam as listed above.  Non-Invasive Vascular Imaging  AAA Duplex (06/24/2013)  Previous size: 5.2 cm (Date: 11/25/12)  Current size:  5.61 cm (Date: 06/24/2013)  CT angiogram 02/04/12: Cardiomegaly and 1. Interval increase in bilobed fusiform aneurysmal dilatation of  the entirety of the abdominal aorta. The juxtarenal component of  the abdominal aortic aneurysm now measures approximately 4.3 x 4.8  cm (previously, 3.9 x 4.6 cm), while the infrarenal component of  the abdominal aortic aneurysm now measures approximately 5.0 x 4.4  cm (previously, 4.9 x 4.2 cm).  2. The amount of irregular mural thrombus within the abdominal  aorta has likely increased in the interval, however does not result  in hemodynamically significant narrowing. No evidence of abdominal  aortic dissection or periaortic stranding.    Medical Decision Making  The patient is a 74 y.o. male who presents with asymptomatic AAA with increasing size to 5.61 cm at the largest diameter.   Based on this  patient's exam and diagnostic studies, and after discussing with Dr. Scot Dock, the patient will follow up as soon as possible  with the following studies:  CT angiogram of abdomen/pelvis and to follow up with Dr. Donnetta Hutching.  The threshold for repair is AAA size > 5.5 cm, growth > 1 cm/yr, and symptomatic status.  I emphasized the importance of maximal medical management including strict control of blood pressure, blood glucose, and lipid levels, antiplatelet agents, obtaining regular exercise, and cessation of smoking.   The patient was given information about AAA including signs, symptoms, treatment, and how to minimize the risk of enlargement and rupture of aneurysms.    The patient was advised to call 911 should the patient experience sudden onset abdominal or back pain.   Thank you for allowing [place] to participate in this patient's care.  Clemon Chambers, RN, MSN, FNP-C [facility] [place] Office: [phone]  Clinic Physician: Scot Dock  06/24/2013, 9:34 AM

## 2013-06-25 ENCOUNTER — Ambulatory Visit: Payer: MEDICARE | Admitting: Family

## 2013-06-25 ENCOUNTER — Telehealth: Payer: Self-pay | Admitting: Vascular Surgery

## 2013-06-25 ENCOUNTER — Inpatient Hospital Stay: Admission: RE | Admit: 2013-06-25 | Payer: MEDICARE | Source: Ambulatory Visit

## 2013-06-25 ENCOUNTER — Other Ambulatory Visit (HOSPITAL_COMMUNITY): Payer: MEDICARE

## 2013-06-25 ENCOUNTER — Ambulatory Visit: Payer: MEDICARE | Admitting: Physician Assistant

## 2013-06-25 LAB — TSH: TSH: 0.797 u[IU]/mL (ref 0.350–4.500)

## 2013-06-25 NOTE — Telephone Encounter (Signed)
Called pt. Told him CTA was scheduled for 1/15 at 2:45. Michela Pitcher he was unable to come. Would like it beginning of next week. Re made appointment for 07/01/13 at 2:30. Called patient again. Gave him new date and time which he was agreeable with. Reminded him where it was and no solid foods 4 hours prior. Patient confirmed understanding. Letter was sent regarding 07/14/13 appointment with Dr. Donnetta Hutching.

## 2013-06-30 ENCOUNTER — Telehealth: Payer: Self-pay | Admitting: Vascular Surgery

## 2013-06-30 NOTE — Telephone Encounter (Signed)
Spoke with patient's wife. She wanted to know why CTA was not on date of TFE visit since they live so far away. Explained to her that CTA was to be done ASAP per suzanne with TFE visit within next few weeks. The morning of TFE visit there were no CTA visits available. Patient and wife verbalized understanding and will be keeping the CTA and TFE visits as is.

## 2013-07-01 ENCOUNTER — Ambulatory Visit
Admission: RE | Admit: 2013-07-01 | Discharge: 2013-07-01 | Disposition: A | Discharge status: Home / Self Care | Payer: MEDICARE | Source: Ambulatory Visit | Attending: Family | Admitting: Family

## 2013-07-01 DIAGNOSIS — I714 Abdominal aortic aneurysm, without rupture, unspecified: Secondary | ICD-10-CM

## 2013-07-01 MED ORDER — IOHEXOL 350 MG/ML SOLN
100.0000 mL | Freq: Once | INTRAVENOUS | Status: AC | PRN
  Administered 2013-07-01: 100 mL via INTRAVENOUS

## 2013-07-13 ENCOUNTER — Encounter: Payer: Self-pay | Admitting: Vascular Surgery

## 2013-07-14 ENCOUNTER — Encounter: Payer: Self-pay | Admitting: Vascular Surgery

## 2013-07-14 ENCOUNTER — Ambulatory Visit (INDEPENDENT_AMBULATORY_CARE_PROVIDER_SITE_OTHER): Payer: MEDICARE | Admitting: Vascular Surgery

## 2013-07-14 VITALS — BP 128/81 | HR 73 | Resp 16 | Ht 72.0 in | Wt 216.9 lb

## 2013-07-14 DIAGNOSIS — I714 Abdominal aortic aneurysm, without rupture, unspecified: Secondary | ICD-10-CM

## 2013-07-14 NOTE — Addendum Note (Signed)
Addended by: Dorthula Rue L on: 07/14/2013 04:49 PM   Modules accepted: Orders

## 2013-07-14 NOTE — Progress Notes (Signed)
The patient presents today for continued discussion of his infrarenal abdominal aortic aneurysm. He has been following this for well over 20 years. He has had slow progressive growth over that time. He does have failing health. He has significant pulmonary dysfunction and had several pulmonary infections this winter the been treated with outpatient antibiotics. He is at a very difficult time recovering his stamina in walking ability after each of these episodes. He has no symptoms referable to his aneurysm. 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 He underwent echocardiogram and is to have a Cardiolite scan next week.  Past Medical History  Diagnosis Date  . Bronchitis   . Depression with anxiety   . AAA (abdominal aortic aneurysm)   . Asthma   . Prediabetes   . Hyperlipidemia   . Hypertension   . COPD (chronic obstructive pulmonary disease)   . GERD (gastroesophageal reflux disease)   . Hiatal hernia   . Arthritis   . DDD (degenerative disc disease)   . Depression   . BPH (benign prostatic hyperplasia)   . Aneurysm   . Cancer Dec. 12, 2014    [facility]    History  Substance Use Topics  . Smoking status: Former Smoker    Types: Cigarettes    Quit date: 06/11/1992  . Smokeless tobacco: Never Used  . Alcohol Use: No    Family History  Problem Relation Age of Onset  . Other Sister     AAA  . Heart disease Mother   . Diabetes Mother   . Heart attack Mother     No Known Allergies  Current outpatient prescriptions:ALPRAZolam (XANAX) 1 MG tablet, Take 1 mg by mouth at bedtime as needed.  , Disp: , Rfl: ;  citalopram (CELEXA) 20 MG tablet, Take 20 mg by mouth daily.  , Disp: , Rfl: ;  doxazosin (CARDURA) 8 MG tablet, Take 1 tablet by mouth daily. , Disp: , Rfl: ;  fluticasone (VERAMYST) 27.5 MCG/SPRAY nasal spray, Place 2 sprays into the nose daily., Disp: , Rfl:  Fluticasone-Salmeterol (ADVAIR DISKUS IN), Inhale into the lungs once. One inhalation 9  am, Disp: , Rfl: ;  lisinopril-hydrochlorothiazide (PRINZIDE,ZESTORETIC) 20-25 MG per tablet, Take 1 tablet by mouth daily., Disp: , Rfl: ;  Multiple Vitamin (MULTIVITAMIN) capsule, Take 1 capsule by mouth daily.  , Disp: , Rfl: ;  pseudoephedrine (SUDAFED) 30 MG tablet, Take 30 mg by mouth every 4 (four) hours as needed for congestion., Disp: , Rfl:  Ranitidine HCl (ZANTAC PO), Take by mouth as needed.  , Disp: , Rfl: ;  tamsulosin (FLOMAX) 0.4 MG CAPS capsule, every morning., Disp: , Rfl: ;  tiotropium (SPIRIVA) 18 MCG inhalation capsule, Place 18 mcg into inhaler and inhale daily.  , Disp: , Rfl: ;  acetaminophen (TYLENOL) 100 MG/ML solution, Take 10 mg/kg by mouth every 4 (four) hours as needed for fever., Disp: , Rfl:  loratadine-pseudoephedrine (CLARITIN-D 12-HOUR) 5-120 MG per tablet, Take 1 tablet by mouth 2 (two) times daily., Disp: , Rfl: ;  oxyCODONE-acetaminophen (PERCOCET/ROXICET) 5-325 MG per tablet, Take by mouth as needed for severe pain (One or two per week)., Disp: , Rfl:   BP 128/81  Pulse 73  Resp 16  Ht 6' (1.829 m)  Wt 216 lb 14.4 oz (98.385 kg)  BMI 29.41 kg/m2  Body mass index is 29.41 kg/(m^2).       On physical exam well-developed well-nourished white male in no acute distress Pulse status: Radial  femoral popliteal and dorsalis pedis pulses are 2+ bilaterally Abdominal exam reveals moderate obesity I do not feel an aneurysm. There is no tenderness Respirations are equal and nonlabored Neurologically he is grossly intact  He underwent a recent repeat duplex in our office suggesting increase in his aneurysm size from 5.2-5.6 by ultrasound. I've compared this with his CT scan from December 2013 and also he underwent a CT scan today which I have reviewed. Today's scan reveals maximal diameter of 5.4. This compares to 5.1 and a CT scan from December 2013. The patient has significant arteriomegaly and his nonaneurysmal aorta above the aneurysm is 4 cm in diameter. This  is unchanged from his prior study  Impression and plan: 3 mm increase in size and 14 months of his infrarenal abdominal aortic aneurysm. I discussed this at great length with the patient and his wife present. I explained that with his pulmonary status he is certainly at very high risk for recovery from aneurysm surgery. I explained with his arteriomegaly that he is not a stent graft candidate. I explained the magnitude of open surgery and feel that the risk of this far outweighs his risk for rupture over the course of 6 months. I explained that with his diffuse arteriomegaly his risk very well may be lower than typical for a 5 cm aneurysm. I recommend that we see him with ultrasound in 6 months for continued discussion. He understands his symptoms of leaking aneurysm in this report should this occur

## 2013-08-27 ENCOUNTER — Ambulatory Visit (INDEPENDENT_AMBULATORY_CARE_PROVIDER_SITE_OTHER): Payer: MEDICARE | Admitting: Internal Medicine

## 2013-08-27 ENCOUNTER — Encounter: Payer: Self-pay | Admitting: Internal Medicine

## 2013-08-27 VITALS — BP 108/72 | HR 76 | Temp 98.2°F | Resp 18 | Ht 72.0 in | Wt 212.6 lb

## 2013-08-27 DIAGNOSIS — Z79899 Other long term (current) drug therapy: Secondary | ICD-10-CM | POA: Insufficient documentation

## 2013-08-27 DIAGNOSIS — R7303 Prediabetes: Secondary | ICD-10-CM

## 2013-08-27 DIAGNOSIS — E291 Testicular hypofunction: Secondary | ICD-10-CM

## 2013-08-27 DIAGNOSIS — R7309 Other abnormal glucose: Secondary | ICD-10-CM

## 2013-08-27 DIAGNOSIS — I10 Essential (primary) hypertension: Secondary | ICD-10-CM

## 2013-08-27 DIAGNOSIS — E785 Hyperlipidemia, unspecified: Secondary | ICD-10-CM

## 2013-08-27 DIAGNOSIS — E559 Vitamin D deficiency, unspecified: Secondary | ICD-10-CM

## 2013-08-27 LAB — CBC WITH DIFFERENTIAL/PLATELET
Basophils Absolute: 0.1 10*3/uL (ref 0.0–0.1)
Basophils Relative: 1 % (ref 0–1)
Eosinophils Absolute: 0.1 10*3/uL (ref 0.0–0.7)
Eosinophils Relative: 2 % (ref 0–5)
HCT: 39.3 % (ref 39.0–52.0)
Hemoglobin: 13.7 g/dL (ref 13.0–17.0)
LYMPHS ABS: 1.2 10*3/uL (ref 0.7–4.0)
LYMPHS PCT: 18 % (ref 12–46)
MCH: 30.9 pg (ref 26.0–34.0)
MCHC: 34.9 g/dL (ref 30.0–36.0)
MCV: 88.7 fL (ref 78.0–100.0)
MONOS PCT: 7 % (ref 3–12)
Monocytes Absolute: 0.4 10*3/uL (ref 0.1–1.0)
NEUTROS ABS: 4.6 10*3/uL (ref 1.7–7.7)
NEUTROS PCT: 72 % (ref 43–77)
PLATELETS: 266 10*3/uL (ref 150–400)
RBC: 4.43 MIL/uL (ref 4.22–5.81)
RDW: 13.8 % (ref 11.5–15.5)
WBC: 6.4 10*3/uL (ref 4.0–10.5)

## 2013-08-27 LAB — HEMOGLOBIN A1C
HEMOGLOBIN A1C: 6.1 % — AB (ref <5.7)
Mean Plasma Glucose: 128 mg/dL — ABNORMAL HIGH (ref <117)

## 2013-08-27 MED ORDER — TESTOSTERONE CYPIONATE 200 MG/ML IM SOLN: INTRAMUSCULAR | Status: DC

## 2013-08-27 NOTE — Progress Notes (Signed)
Patient ID: Walter Snyder, male   DOB: 1941/02/12, 73 y.o.   MRN: [id number]    This very nice 73 y.o. MWM presents for 3 month follow up with Hypertension, Hyperlipidemia, Pre-Diabetes and Vitamin D Deficiency.    HTN predates since 21. BP has been controlled at home. Today's BP: 108/72 mmHg . Patient denies any cardiac type chest pain, palpitations, dyspnea/orthopnea/PND, dizziness, claudication, or dependent edema. Patient has known AAA and is followed by Dr Early deferring surgy for concern about patient's poor operative risk due to his COPD.   Hyperlipidemia is not controlled with diet & Zetia. Last Cholesterol was 218, Triglycerides were 180, HDL 35 and LDL 147 in Sept 2014 and apparently he never started the adde dAtorvastatin as recommended. Patient denies myalgias or other med SE's.    Also, the patient has history of PreDiabetes with A1c 5.9% in 2009 and A1c's have usually been fluctuating up to the 6.0's and last A1c was 5.9% in Sept 2014. Patient denies any symptoms of reactive hypoglycemia, diabetic polys, paresthesias or visual blurring.   Further, Patient has history of Vitamin D Deficiency of 34 in 2010 and with last vitamin D of 47 in Sept 2014. Patient supplements vitamin D without any suspected side-effects.  Medication Sig  . ALPRAZolam (XANAX) 1 MG tablet Take 1 mg by mouth at bedtime as needed.    . doxazosin (CARDURA) 8 MG tablet Take 1 tablet by mouth daily.   . fluticasone (VERAMYST) 27.5 MCG/SPRAY  Place 2 sprays into the nose daily.  . Fluticasone-Salmeterol (ADVAIR DISKUS IN) Inhale into the lungs once. One inhalation 9 am  . lisinopril-HCT 20-25 MG  Take 1 tablet by mouth daily.  Marland Kitchen loratadine-pseudoephedrine (CLARITIN-D 12-HOUR) 5-120 MG per tablet Take 1 tablet by mouth 2 (two) times daily.  . Multiple Vitamin (MULTIVITAMIN) capsule Take 1 capsule by mouth daily.    . pseudoephedrine (SUDAFED) 30 MG tablet Take 30 mg by mouth q4h prn  . Ranitidine HCl (ZANTAC PO)  Take 300 mg by mouth as needed.   . tamsulosin (FLOMAX) 0.4 MG CAPS capsule every morning.  . tiotropium (SPIRIVA) 18 MCG inhalation capsule Place 18 mcg into inhaler and inhale daily.       No Known Allergies  PMHx:   Past Medical History  Diagnosis Date  . Bronchitis   . Depression with anxiety   . AAA (abdominal aortic aneurysm)   . Asthma   . Prediabetes   . Hyperlipidemia   . Hypertension   . COPD (chronic obstructive pulmonary disease)   . GERD (gastroesophageal reflux disease)   . Hiatal hernia   . Arthritis   . DDD (degenerative disc disease)   . Depression   . BPH (benign prostatic hyperplasia)   . Aneurysm   . Cancer Dec. 12, 2014    [facility]    FHx:    Reviewed / unchanged  SHx:    Reviewed / unchanged   Systems Review: Constitutional: Denies fever, chills, wt changes, headaches, insomnia, fatigue, night sweats, change in appetite. Eyes: Denies redness, blurred vision, diplopia, discharge, itchy, watery eyes.  ENT: Denies discharge, congestion, post nasal drip, epistaxis, sore throat, earache, hearing loss, dental pain, tinnitus, vertigo, sinus pain, snoring.  CV: Denies chest pain, palpitations, irregular heartbeat, syncope, dyspnea, diaphoresis, orthopnea, PND, claudication, edema. Respiratory: denies cough, dyspnea, DOE, pleurisy, hoarseness, laryngitis, wheezing.  Gastrointestinal: Denies dysphagia, odynophagia, heartburn, reflux, water brash, abdominal pain or cramps, nausea, vomiting, bloating, diarrhea, constipation, hematemesis, melena, hematochezia,  or  hemorrhoids. Genitourinary: Denies dysuria, frequency, urgency, nocturia, hesitancy, discharge, hematuria, flank pain. Musculoskeletal: Denies arthralgias, myalgias, stiffness, jt. swelling, pain, limp, strain/sprain.  Skin: Denies pruritus, rash, hives, warts, acne, eczema, change in skin lesion(s). Neuro: No weakness, tremor, incoordination, spasms, paresthesia, or pain. Psychiatric: Denies confusion,  memory loss, or sensory loss. Endo: Denies change in weight, skin, hair change.  Heme/Lymph: No excessive bleeding, bruising, orenlarged lymph nodes.   Exam:                       BP 108/72  Pulse 76  Temp 98.2 F   Resp 18  Ht 6'   Wt 212 lb 9.6 oz   BMI 28.83 kg/m2  Appears well nourished - in no distress. Eyes: PERRLA, EOMs, conjunctiva no swelling or erythema. Sinuses: No frontal/maxillary tenderness ENT/Mouth: EAC's clear, TM's nl w/o erythema, bulging. Nares clear w/o erythema, swelling, exudates. Oropharynx clear without erythema or exudates. Oral hygiene is good. Tongue normal, non obstructing. Hearing intact.  Neck: Supple. Thyroid nl. Car 2+/2+ without bruits, nodes or JVD. Chest: Respirations nl with BS clear & equal w/o rales, rhonchi, wheezing or stridor.  Cor: Heart sounds normal w/ regular rate and rhythm without sig. murmurs, gallops, clicks, or rubs. Peripheral pulses normal and equal  without edema.  Abdomen: Soft & bowel sounds normal. Non-tender w/o guarding, rebound, hernias, masses, or organomegaly.  Lymphatics: Unremarkable.  Musculoskeletal: Full ROM all peripheral extremities, joint stability, 5/5 strength, and normal gait.  Skin: Warm, dry without exposed rashes, lesions, ecchymosis apparent.  Neuro: Cranial nerves intact, reflexes equal bilaterally. Sensory-motor testing grossly intact. Tendon reflexes grossly intact.  Pysch: Alert & oriented x 3. Insight and judgement nl & appropriate. No ideations.  Assessment and Plan:  1. Hypertension - Continue monitor blood pressure at home. Continue diet/meds same.  2. Hyperlipidemia - Continue diet/meds, exercise,& lifestyle modifications. Continue monitor periodic cholesterol/liver & renal functions   3. Pre-diabetes - Continue diet, exercise, lifestyle modifications. Monitor appropriate labs.  4. Vitamin D Deficiency - Continue supplementation.  5. COPD  6. AAA  7. GERD  Recommended regular exercise,  BP monitoring, weight control, and discussed med and SE's. Recommended labs to assess and monitor clinical status. Further disposition pending results of labs.

## 2013-08-27 NOTE — Patient Instructions (Signed)

## 2013-08-28 LAB — BASIC METABOLIC PANEL WITH GFR
BUN: 18 mg/dL (ref 6–23)
CHLORIDE: 99 meq/L (ref 96–112)
CO2: 30 meq/L (ref 19–32)
Calcium: 9.1 mg/dL (ref 8.4–10.5)
Creat: 0.95 mg/dL (ref 0.50–1.35)
GFR, Est African American: >89 mL/min
GFR, Est Non African American: 79 mL/min
GLUCOSE: 87 mg/dL (ref 70–99)
POTASSIUM: 4.3 meq/L (ref 3.5–5.3)
SODIUM: 138 meq/L (ref 135–145)

## 2013-08-28 LAB — MAGNESIUM: MAGNESIUM: 1.8 mg/dL (ref 1.5–2.5)

## 2013-08-28 LAB — VITAMIN D 25 HYDROXY (VIT D DEFICIENCY, FRACTURES): VIT D 25 HYDROXY: 54 ng/mL (ref 30–89)

## 2013-08-28 LAB — HEPATIC FUNCTION PANEL
ALT: 13 U/L (ref 0–53)
AST: 15 U/L (ref 0–37)
Albumin: 4.1 g/dL (ref 3.5–5.2)
Alkaline Phosphatase: 84 U/L (ref 39–117)
BILIRUBIN INDIRECT: 0.4 mg/dL (ref 0.2–1.2)
BILIRUBIN TOTAL: 0.5 mg/dL (ref 0.2–1.2)
Bilirubin, Direct: 0.1 mg/dL (ref 0.0–0.3)
Total Protein: 6.9 g/dL (ref 6.0–8.3)

## 2013-08-28 LAB — LIPID PANEL
Cholesterol: 205 mg/dL — ABNORMAL HIGH (ref 0–200)
HDL: 33 mg/dL — ABNORMAL LOW (ref >39)
LDL Cholesterol: 137 mg/dL — ABNORMAL HIGH (ref 0–99)
Total CHOL/HDL Ratio: 6.2 Ratio
Triglycerides: 173 mg/dL — ABNORMAL HIGH (ref <150)
VLDL: 35 mg/dL (ref 0–40)

## 2013-08-28 LAB — TSH: TSH: 0.627 u[IU]/mL (ref 0.350–4.500)

## 2013-08-28 LAB — TESTOSTERONE: TESTOSTERONE: 194 ng/dL — AB (ref 300–890)

## 2013-08-28 LAB — INSULIN, FASTING: Insulin fasting, serum: 45 u[IU]/mL — ABNORMAL HIGH (ref 3–28)

## 2013-08-31 ENCOUNTER — Other Ambulatory Visit: Payer: Self-pay | Admitting: *Deleted

## 2013-09-23 ENCOUNTER — Other Ambulatory Visit: Payer: Self-pay | Admitting: *Deleted

## 2013-09-23 MED ORDER — ALPRAZOLAM 1 MG PO TABS: 1.0000 mg | ORAL_TABLET | Freq: Every evening | ORAL | Status: DC | PRN

## 2013-11-30 ENCOUNTER — Ambulatory Visit: Payer: Self-pay | Admitting: Emergency Medicine

## 2013-12-14 ENCOUNTER — Other Ambulatory Visit: Payer: Self-pay | Admitting: Internal Medicine

## 2013-12-14 DIAGNOSIS — G4700 Insomnia, unspecified: Secondary | ICD-10-CM

## 2013-12-14 MED ORDER — ALPRAZOLAM 1 MG PO TABS: 1.0000 mg | ORAL_TABLET | Freq: Every evening | ORAL | Status: DC | PRN

## 2013-12-22 ENCOUNTER — Ambulatory Visit (INDEPENDENT_AMBULATORY_CARE_PROVIDER_SITE_OTHER): Payer: MEDICARE | Admitting: Emergency Medicine

## 2013-12-22 ENCOUNTER — Encounter: Payer: Self-pay | Admitting: Emergency Medicine

## 2013-12-22 VITALS — BP 122/80 | HR 68 | Temp 98.0°F | Resp 16 | Ht 72.5 in | Wt 205.0 lb

## 2013-12-22 DIAGNOSIS — J309 Allergic rhinitis, unspecified: Secondary | ICD-10-CM

## 2013-12-22 DIAGNOSIS — R7309 Other abnormal glucose: Secondary | ICD-10-CM

## 2013-12-22 DIAGNOSIS — E782 Mixed hyperlipidemia: Secondary | ICD-10-CM

## 2013-12-22 DIAGNOSIS — I10 Essential (primary) hypertension: Secondary | ICD-10-CM

## 2013-12-22 LAB — CBC WITH DIFFERENTIAL/PLATELET
BASOS ABS: 0 10*3/uL (ref 0.0–0.1)
BASOS PCT: 0 % (ref 0–1)
Eosinophils Absolute: 0.2 10*3/uL (ref 0.0–0.7)
Eosinophils Relative: 2 % (ref 0–5)
HCT: 40.1 % (ref 39.0–52.0)
Hemoglobin: 14 g/dL (ref 13.0–17.0)
Lymphocytes Relative: 12 % (ref 12–46)
Lymphs Abs: 1 10*3/uL (ref 0.7–4.0)
MCH: 30.6 pg (ref 26.0–34.0)
MCHC: 34.9 g/dL (ref 30.0–36.0)
MCV: 87.7 fL (ref 78.0–100.0)
MONOS PCT: 6 % (ref 3–12)
Monocytes Absolute: 0.5 10*3/uL (ref 0.1–1.0)
NEUTROS ABS: 6.8 10*3/uL (ref 1.7–7.7)
NEUTROS PCT: 80 % — AB (ref 43–77)
PLATELETS: 249 10*3/uL (ref 150–400)
RBC: 4.57 MIL/uL (ref 4.22–5.81)
RDW: 13 % (ref 11.5–15.5)
WBC: 8.5 10*3/uL (ref 4.0–10.5)

## 2013-12-22 LAB — HEMOGLOBIN A1C
HEMOGLOBIN A1C: 5.7 % — AB (ref <5.7)
MEAN PLASMA GLUCOSE: 117 mg/dL — AB (ref <117)

## 2013-12-22 MED ORDER — DEXAMETHASONE SODIUM PHOSPHATE 100 MG/10ML IJ SOLN
10.0000 mg | Freq: Once | INTRAMUSCULAR | Status: AC
  Administered 2013-12-22: 10 mg via INTRAMUSCULAR

## 2013-12-22 NOTE — Patient Instructions (Signed)

## 2013-12-22 NOTE — Progress Notes (Signed)
Subjective:    Patient ID: Walter Snyder, male    DOB: [birth year], 73 y.o.   MRN: [id number]  HPI Comments: 74 yo WM presents for 3 month F/U for HTN, Cholesterol, Pre-Dm, D. Deficient. He is trying to eat better/ smaller portions. He is exercising routinely. He ate bad yesterday for his birthday. He notes he is not checking BP.   He notes + drainage from sinus chronic. He has productive cough occasionally. He notes OTC has not helped. He uses nose spray daily.  WBC             6.4   08/27/2013 HGB            13.7   08/27/2013 HCT            39.3   08/27/2013 PLT             266   08/27/2013 GLUCOSE          87   08/27/2013 CHOL            205   08/27/2013 TRIG            173   08/27/2013 HDL              33   08/27/2013 LDLCALC         137   08/27/2013 ALT              13   08/27/2013 AST              15   08/27/2013 NA              138   08/27/2013 K               4.3   08/27/2013 CL               99   08/27/2013 CREATININE     0.95   08/27/2013 BUN              18   08/27/2013 CO2              30   08/27/2013 TSH           0.627   08/27/2013 HGBA1C          6.1   08/27/2013   Hyperlipidemia  Hypertension  Gastrophageal Reflux     Medication List       This list is accurate as of: 12/22/13 11:41 AM.  Always use your most recent med list.               ADVAIR DISKUS IN  Inhale into the lungs once. One inhalation 9 am     ALPRAZolam 1 MG tablet  Commonly known as:  XANAX  Take 1 tablet (1 mg total) by mouth at bedtime as needed.     atorvastatin 80 MG tablet  Commonly known as:  LIPITOR     cholecalciferol 1000 UNITS tablet  Commonly known as:  VITAMIN D  Take 1,000 Units by mouth daily.     citalopram 40 MG tablet  Commonly known as:  CELEXA  Take 40 mg by mouth daily.     doxazosin 8 MG tablet  Commonly known as:  CARDURA  Take 1 tablet by mouth daily.     ezetimibe 10 MG tablet  Commonly known as:  ZETIA  Take 10 mg by mouth daily.     fluticasone 27.5  MCG/SPRAY nasal spray  Commonly known as:  VERAMYST  Place 2 sprays into the nose daily.     HYDROcodone-acetaminophen 5-325 MG per tablet  Commonly known as:  NORCO/VICODIN     lisinopril-hydrochlorothiazide 20-25 MG per tablet  Commonly known as:  PRINZIDE,ZESTORETIC  Take 1 tablet by mouth daily.     loratadine-pseudoephedrine 5-120 MG per tablet  Commonly known as:  CLARITIN-D 12-hour  Take 1 tablet by mouth 2 (two) times daily.     multivitamin capsule  Take 1 capsule by mouth daily.     pseudoephedrine 30 MG tablet  Commonly known as:  SUDAFED  Take 30 mg by mouth every 4 (four) hours as needed for congestion.     tamsulosin 0.4 MG Caps capsule  Commonly known as:  FLOMAX  every morning.     testosterone cypionate 200 MG/ML injection  Commonly known as:  DEPO-TESTOSTERONE  Inject 2 cc intra muscular every 2 weeks and 3 cc syringes and 1 inch 21 G needles     tiotropium 18 MCG inhalation capsule  Commonly known as:  SPIRIVA  Place 18 mcg into inhaler and inhale daily.     ZANTAC PO  Take 300 mg by mouth as needed.       No Known Allergies  Past Medical History  Diagnosis Date  . Bronchitis   . Depression with anxiety   . AAA (abdominal aortic aneurysm)   . Asthma   . Prediabetes   . Hyperlipidemia   . Hypertension   . COPD (chronic obstructive pulmonary disease)   . GERD (gastroesophageal reflux disease)   . Hiatal hernia   . Arthritis   . DDD (degenerative disc disease)   . Depression   . BPH (benign prostatic hyperplasia)   . Aneurysm   . Cancer Dec. 12, 2014    [facility]     Review of Systems  HENT: Positive for congestion and sinus pressure.   All other systems reviewed and are negative.  BP 122/80  Pulse 68  Temp(Src) 98 F (36.7 C) (Temporal)  Resp 16  Ht 6' 0.5" (1.842 m)  Wt 205 lb (92.987 kg)  BMI 27.41 kg/m2     Objective:   Physical Exam  Nursing note and vitals reviewed. Constitutional: He is oriented to person, place,  and time. He appears well-developed and well-nourished.  HENT:  Head: Normocephalic and atraumatic.  Right Ear: External ear normal.  Left Ear: External ear normal.  Nose: Nose normal.  Mouth/Throat: Oropharynx is clear and moist.  Cloudy TM's bilaterally   Eyes: Conjunctivae and EOM are normal.  Neck: Normal range of motion. Neck supple. No JVD present. No thyromegaly present.  Cardiovascular: Normal rate, regular rhythm, normal heart sounds and intact distal pulses.   Pulmonary/Chest: Effort normal and breath sounds normal.  Abdominal: Soft. Bowel sounds are normal. He exhibits no distension. There is no tenderness.  Musculoskeletal: Normal range of motion. He exhibits no edema and no tenderness.  Lymphadenopathy:    He has no cervical adenopathy.  Neurological: He is alert and oriented to person, place, and time. No cranial nerve deficit. Coordination normal.  Skin: Skin is warm and dry.  Psychiatric: He has a normal mood and affect. His behavior is normal. Judgment and thought content normal.          Assessment & Plan:  1.  3 month F/U for HTN, Cholesterol, Pre-Dm, D. Deficient. Needs healthy diet, cardio QD and obtain healthy weight. Check Labs, Check BP if >130/80 call office  2. Allergic rhinitis- claritin OTC, increase H2o, allergy hygiene explained. Decadron 10 mg IM, check labs if + w/c in ABX

## 2013-12-23 ENCOUNTER — Other Ambulatory Visit: Payer: Self-pay | Admitting: Emergency Medicine

## 2013-12-23 LAB — LIPID PANEL
Cholesterol: 165 mg/dL (ref 0–200)
HDL: 32 mg/dL — AB (ref >39)
LDL Cholesterol: 95 mg/dL (ref 0–99)
TRIGLYCERIDES: 192 mg/dL — AB (ref <150)
Total CHOL/HDL Ratio: 5.2 Ratio
VLDL: 38 mg/dL (ref 0–40)

## 2013-12-23 LAB — BASIC METABOLIC PANEL WITH GFR
BUN: 19 mg/dL (ref 6–23)
CALCIUM: 8.9 mg/dL (ref 8.4–10.5)
CO2: 29 mEq/L (ref 19–32)
CREATININE: 0.74 mg/dL (ref 0.50–1.35)
Chloride: 101 mEq/L (ref 96–112)
Glucose, Bld: 147 mg/dL — ABNORMAL HIGH (ref 70–99)
Potassium: 3.6 mEq/L (ref 3.5–5.3)
SODIUM: 140 meq/L (ref 135–145)

## 2013-12-23 LAB — HEPATIC FUNCTION PANEL
ALT: 11 U/L (ref 0–53)
AST: 13 U/L (ref 0–37)
Albumin: 4 g/dL (ref 3.5–5.2)
Alkaline Phosphatase: 87 U/L (ref 39–117)
BILIRUBIN DIRECT: 0.1 mg/dL (ref 0.0–0.3)
Indirect Bilirubin: 0.3 mg/dL (ref 0.2–1.2)
TOTAL PROTEIN: 6.4 g/dL (ref 6.0–8.3)
Total Bilirubin: 0.4 mg/dL (ref 0.2–1.2)

## 2013-12-23 MED ORDER — FLUTICASONE PROPIONATE 50 MCG/ACT NA SUSP: 1.0000 | Freq: Every day | NASAL | Status: DC

## 2013-12-23 MED ORDER — TAMSULOSIN HCL 0.4 MG PO CAPS: 0.4000 mg | ORAL_CAPSULE | Freq: Every morning | ORAL | Status: DC

## 2013-12-23 MED ORDER — CITALOPRAM HYDROBROMIDE 40 MG PO TABS: 40.0000 mg | ORAL_TABLET | Freq: Every day | ORAL | Status: DC

## 2013-12-23 MED ORDER — ATORVASTATIN CALCIUM 80 MG PO TABS: ORAL_TABLET | ORAL | Status: DC

## 2013-12-23 MED ORDER — DOXAZOSIN MESYLATE 8 MG PO TABS: 8.0000 mg | ORAL_TABLET | Freq: Every day | ORAL | Status: DC

## 2013-12-25 ENCOUNTER — Other Ambulatory Visit: Payer: Self-pay | Admitting: Emergency Medicine

## 2014-01-18 ENCOUNTER — Encounter: Payer: Self-pay | Admitting: Vascular Surgery

## 2014-01-19 ENCOUNTER — Ambulatory Visit (INDEPENDENT_AMBULATORY_CARE_PROVIDER_SITE_OTHER): Payer: MEDICARE | Admitting: Vascular Surgery

## 2014-01-19 ENCOUNTER — Ambulatory Visit (HOSPITAL_COMMUNITY)
Admission: RE | Admit: 2014-01-19 | Discharge: 2014-01-19 | Disposition: A | Discharge status: Home / Self Care | Payer: MEDICARE | Source: Ambulatory Visit | Attending: Vascular Surgery | Admitting: Vascular Surgery

## 2014-01-19 ENCOUNTER — Encounter: Payer: Self-pay | Admitting: Vascular Surgery

## 2014-01-19 VITALS — BP 124/74 | HR 72 | Ht 72.0 in | Wt 202.9 lb

## 2014-01-19 DIAGNOSIS — I714 Abdominal aortic aneurysm, without rupture, unspecified: Secondary | ICD-10-CM | POA: Diagnosis not present

## 2014-01-19 NOTE — Progress Notes (Signed)
Here today for continued discussion of his infrarenal abdominal aortic aneurysm. He looks quite good today. He is here with his wife. He has been stable from a pulmonary standpoint. No new major medical problems.  Past Medical History  Diagnosis Date  . Bronchitis   . Depression with anxiety   . AAA (abdominal aortic aneurysm)   . Asthma   . Prediabetes   . Hyperlipidemia   . Hypertension   . COPD (chronic obstructive pulmonary disease)   . GERD (gastroesophageal reflux disease)   . Hiatal hernia   . Arthritis   . DDD (degenerative disc disease)   . Depression   . BPH (benign prostatic hyperplasia)   . Aneurysm   . Cancer Dec. 12, 2014    [facility]    History  Substance Use Topics  . Smoking status: Former Smoker    Types: Cigarettes    Quit date: 06/11/1992  . Smokeless tobacco: Never Used  . Alcohol Use: No    Family History  Problem Relation Age of Onset  . Other Sister     AAA  . Heart disease Mother   . Diabetes Mother   . Heart attack Mother   . Heart disease Father   . Cancer Brother   . Diabetes Brother   . Heart disease Brother     before age 68  . Varicose Veins Brother   . Heart attack Brother   . Heart disease Son     before age 85    No Known Allergies  Current outpatient prescriptions:ALPRAZolam (XANAX) 1 MG tablet, Take 1 tablet (1 mg total) by mouth at bedtime as needed., Disp: 90 tablet, Rfl: 0;  atorvastatin (LIPITOR) 80 MG tablet, Take 1 daily titrate as directed for cholesterol, Disp: 90 tablet, Rfl: 1;  cholecalciferol (VITAMIN D) 1000 UNITS tablet, Take 1,000 Units by mouth daily., Disp: , Rfl:  citalopram (CELEXA) 40 MG tablet, Take 1 tablet (40 mg total) by mouth daily. FOR Mood, Disp: 90 tablet, Rfl: 1;  doxazosin (CARDURA) 8 MG tablet, Take 1 tablet (8 mg total) by mouth daily. For prostate and blood pressure, Disp: 90 tablet, Rfl: 1;  fluticasone (FLONASE) 50 MCG/ACT nasal spray, Place 1 spray into both nostrils daily., Disp: 16 g, Rfl:  6 Fluticasone-Salmeterol (ADVAIR DISKUS IN), Inhale into the lungs once. One inhalation 9 am, Disp: , Rfl: ;  loratadine-pseudoephedrine (CLARITIN-D 12-HOUR) 5-120 MG per tablet, Take 1 tablet by mouth 2 (two) times daily., Disp: , Rfl: ;  Multiple Vitamin (MULTIVITAMIN) capsule, Take 1 capsule by mouth daily.  , Disp: , Rfl: ;  Ranitidine HCl (ZANTAC PO), Take 300 mg by mouth as needed. , Disp: , Rfl:  tamsulosin (FLOMAX) 0.4 MG CAPS capsule, Take 1 capsule (0.4 mg total) by mouth every morning. For prostate, Disp: 90 capsule, Rfl: 1;  testosterone cypionate (DEPO-TESTOSTERONE) 200 MG/ML injection, Inject 2 cc intra muscular every 2 weeks and 3 cc syringes and 1 inch 21 G needles, Disp: 10 mL, Rfl: 3;  tiotropium (SPIRIVA) 18 MCG inhalation capsule, Place 18 mcg into inhaler and inhale daily.  , Disp: , Rfl:  ezetimibe (ZETIA) 10 MG tablet, Take 10 mg by mouth daily., Disp: , Rfl: ;  HYDROcodone-acetaminophen (NORCO/VICODIN) 5-325 MG per tablet, , Disp: , Rfl: ;  lisinopril-hydrochlorothiazide (PRINZIDE,ZESTORETIC) 20-25 MG per tablet, Take 1 tablet by mouth daily., Disp: , Rfl: ;  pseudoephedrine (SUDAFED) 30 MG tablet, Take 30 mg by mouth every 4 (four) hours as needed for congestion., Disp: ,  Rfl:  [DISCONTINUED] fluticasone (VERAMYST) 27.5 MCG/SPRAY nasal spray, Place 2 sprays into the nose daily., Disp: , Rfl:   BP 124/74  Pulse 72  Ht 6' (1.829 m)  Wt 202 lb 14.4 oz (92.035 kg)  BMI 27.51 kg/m2  SpO2 96%  Body mass index is 27.51 kg/(m^2).       On physical exam that he is in no acute distress Pulse status 2+ radial 2+ femoral to popliteal 2+ posterior tibial pulses bilaterally Abdominal exam reveals a palpable aneurysm which is nontender. Respirations are equal and nonlabored  Ultrasound of his aorta today reveals maximal diameter of 5.3 cm. This compares with maximal diameter 6 months ago of 5.6 cm.   Impression and plan stable infrarenal abdominal aortic aneurysm. We did discuss  6 months ago the potential for elective repair. He was having a great deal pulmonary difficulty that time and felt that his risk for rupture was less than his risk for surgery. Unfortunately today is aneurysm size measured smaller than it was 6 months ago. I explained it was possible for a 5.3 cm aneurysm to rupture but certainly unlikely in March greater chance for difficulty with the treatment. We will see him again in 6 months repeat ultrasound for continued followup. I did discuss symptoms of leaking aneurysm the nose report immediately to the hospital should this occur

## 2014-01-19 NOTE — Addendum Note (Signed)
Addended by: Mena Goes on: 01/19/2014 04:32 PM   Modules accepted: Orders

## 2014-03-02 ENCOUNTER — Encounter: Payer: Self-pay | Admitting: Internal Medicine

## 2014-03-25 ENCOUNTER — Ambulatory Visit (INDEPENDENT_AMBULATORY_CARE_PROVIDER_SITE_OTHER): Payer: MEDICARE | Admitting: Internal Medicine

## 2014-03-25 ENCOUNTER — Encounter: Payer: Self-pay | Admitting: Internal Medicine

## 2014-03-25 VITALS — BP 120/72 | HR 88 | Temp 97.7°F | Resp 16 | Ht 72.5 in | Wt 209.2 lb

## 2014-03-25 DIAGNOSIS — R7303 Prediabetes: Secondary | ICD-10-CM

## 2014-03-25 DIAGNOSIS — Z9181 History of falling: Secondary | ICD-10-CM

## 2014-03-25 DIAGNOSIS — R6889 Other general symptoms and signs: Secondary | ICD-10-CM

## 2014-03-25 DIAGNOSIS — Z1331 Encounter for screening for depression: Secondary | ICD-10-CM

## 2014-03-25 DIAGNOSIS — Z0001 Encounter for general adult medical examination with abnormal findings: Secondary | ICD-10-CM

## 2014-03-25 DIAGNOSIS — Z79899 Other long term (current) drug therapy: Secondary | ICD-10-CM

## 2014-03-25 DIAGNOSIS — F32A Depression, unspecified: Secondary | ICD-10-CM

## 2014-03-25 DIAGNOSIS — F329 Major depressive disorder, single episode, unspecified: Secondary | ICD-10-CM

## 2014-03-25 DIAGNOSIS — E559 Vitamin D deficiency, unspecified: Secondary | ICD-10-CM

## 2014-03-25 DIAGNOSIS — E785 Hyperlipidemia, unspecified: Secondary | ICD-10-CM

## 2014-03-25 DIAGNOSIS — I10 Essential (primary) hypertension: Secondary | ICD-10-CM

## 2014-03-25 DIAGNOSIS — G4700 Insomnia, unspecified: Secondary | ICD-10-CM

## 2014-03-25 LAB — CBC WITH DIFFERENTIAL/PLATELET
BASOS ABS: 0 10*3/uL (ref 0.0–0.1)
Basophils Relative: 0 % (ref 0–1)
EOS PCT: 2 % (ref 0–5)
Eosinophils Absolute: 0.2 10*3/uL (ref 0.0–0.7)
HCT: 39.3 % (ref 39.0–52.0)
Hemoglobin: 13.3 g/dL (ref 13.0–17.0)
LYMPHS PCT: 17 % (ref 12–46)
Lymphs Abs: 1.3 10*3/uL (ref 0.7–4.0)
MCH: 30.6 pg (ref 26.0–34.0)
MCHC: 33.8 g/dL (ref 30.0–36.0)
MCV: 90.6 fL (ref 78.0–100.0)
Monocytes Absolute: 0.5 10*3/uL (ref 0.1–1.0)
Monocytes Relative: 7 % (ref 3–12)
NEUTROS PCT: 74 % (ref 43–77)
Neutro Abs: 5.6 10*3/uL (ref 1.7–7.7)
Platelets: 251 10*3/uL (ref 150–400)
RBC: 4.34 MIL/uL (ref 4.22–5.81)
RDW: 13.4 % (ref 11.5–15.5)
WBC: 7.5 10*3/uL (ref 4.0–10.5)

## 2014-03-25 LAB — HEMOGLOBIN A1C
Hgb A1c MFr Bld: 5.8 % — ABNORMAL HIGH (ref <5.7)
Mean Plasma Glucose: 120 mg/dL — ABNORMAL HIGH (ref <117)

## 2014-03-25 MED ORDER — TESTOSTERONE CYPIONATE 200 MG/ML IM SOLN: INTRAMUSCULAR | Status: DC

## 2014-03-25 MED ORDER — ALPRAZOLAM 1 MG PO TABS: 1.0000 mg | ORAL_TABLET | Freq: Every evening | ORAL | Status: DC | PRN

## 2014-03-25 MED ORDER — OXYBUTYNIN CHLORIDE 5 MG PO TABS: ORAL_TABLET | ORAL | Status: DC

## 2014-03-25 NOTE — Patient Instructions (Signed)

## 2014-03-25 NOTE — Progress Notes (Signed)
Patient ID: Walter Snyder, male   DOB: January 17, 1941, 73 y.o.   MRN: [id number]  MEDICARE ANNUAL WELLNESS VISIT AND F?U OV  Assessment:   1. Essential hypertension  - TSH  2. Hyperlipidemia  - Lipid panel  3. Prediabetes  - Hemoglobin A1c - Insulin, fasting  4. Vitamin D deficiency  - Vit D  25 hydroxy (rtn osteoporosis monitoring)  5. Medication management  - CBC with Differential - BASIC METABOLIC PANEL WITH GFR - Hepatic function panel - Magnesium  6. Encounter for general adult medical examination with abnormal findings   7. Insomnia  - ALPRAZolam (XANAX) 1 MG tablet; Take 1 tablet (1 mg total) by mouth at bedtime as needed. For sleep  Dispense: 90 tablet; Refill: 1  Plan:   During the course of the visit the patient was educated and counseled about appropriate screening and preventive services including:    Pneumococcal vaccine   Influenza vaccine  Td vaccine  Screening electrocardiogram  Bone densitometry screening  Colorectal cancer screening  Diabetes screening  Glaucoma screening  Nutrition counseling   Advanced directives: requested  Screening recommendations, referrals: Vaccinations: Dt vaccine 2005 and he declined booster today Influenza vaccine HD 03/11/2014 Pneumococcal vaccine 2008 Prevnar vaccine declined Shingles vaccine declined Hep B vaccine declined  Nutrition assessed and recommended  Colonoscopy 2003 & Pt declines f/u unless hemeoccults (+) Recommended yearly ophthalmology/optometry visit for glaucoma screening and checkup Recommended yearly dental visit for hygiene and checkup Advanced directives - Yes  Conditions/risks identified: BMI: Discussed weight loss, diet, and increase physical activity.  Increase physical activity: AHA recommends 150 minutes of physical activity a week.  Medications reviewed PreDiabetes is at goal, ACE/ARB therapy: Yes. Urinary Incontinence is not an issue: discussed non pharmacology and  pharmacology options.  Fall risk: low- discussed PT, home fall assessment, medications.    Subjective:  Walter Snyder is a 73 y.o. MWM who presents for Medicare Annual Wellness Visit and f/u OVl.  Date of last medicare wellness visit is unknown.  He has had elevated blood pressure since 1990. His blood pressure has been controlled at home, today their BP is BP: 120/72 mmHg He does not workout. He denies chest pain, shortness of breath, dizziness.  He is on cholesterol medication and denies myalgias. His cholesterol is at goal. The cholesterol last visit was:   Lab Results  Component Value Date   CHOL 165 12/22/2013   HDL 32* 12/22/2013   LDLCALC 95 12/22/2013   TRIG 192* 12/22/2013   CHOLHDL 5.2 12/22/2013   He has had prediabetes for 6 years since 2009. He has not been working on diet and exercise for prediabetes, and denies foot ulcerations, hyperglycemia, hypoglycemia , paresthesia of the feet, polydipsia, polyuria and visual disturbances. Last A1C in the office was:  Lab Results  Component Value Date   HGBA1C 5.7* 12/22/2013   Patient is on Vitamin D supplement.  (Vit D was 34 in 2010) Lab Results  Component Value Date   VD25OH 54 08/27/2013      Names of Other Physician/Practitioners you currently use: 1. [facility] here for primary care 2. Dr Nori Riis Ward in [place], eye doctor, last visit 2013 3. Dr ?, dentist, last visit 2012  Patient Care Team: Unk Pinto, MD as PCP - General (Internal Medicine)  Medication Review: Current Outpatient Prescriptions on File Prior to Visit  Medication Sig Dispense Refill  . cholecalciferol (VITAMIN D) 1000 UNITS tablet Take 1,000 Units by mouth daily.      Marland Kitchen  citalopram (CELEXA) 40 MG tablet Take 1 tablet (40 mg total) by mouth daily. FOR Mood  90 tablet  1  . doxazosin (CARDURA) 8 MG tablet Take 1 tablet (8 mg total) by mouth daily. For prostate and blood pressure  90 tablet  1  . ezetimibe (ZETIA)  10 MG tablet Take 10 mg by mouth daily.      . fluticasone (FLONASE) 50 MCG/ACT nasal spray Place 1 spray into both nostrils daily.  16 g  6  . Fluticasone-Salmeterol (ADVAIR DISKUS IN) Inhale into the lungs once. One inhalation 9 am      . HYDROcodone-acetaminophen (NORCO/VICODIN) 5-325 MG per tablet       . lisinopril-hydrochlorothiazide (PRINZIDE,ZESTORETIC) 20-25 MG per tablet Take 1 tablet by mouth daily.      Marland Kitchen loratadine-pseudoephedrine (CLARITIN-D 12-HOUR) 5-120 MG per tablet Take 1 tablet by mouth 2 (two) times daily.      . Multiple Vitamin (MULTIVITAMIN) capsule Take 1 capsule by mouth daily.        . tamsulosin (FLOMAX) 0.4 MG CAPS capsule Take 1 capsule (0.4 mg total) by mouth every morning. For prostate  90 capsule  1  . tiotropium (SPIRIVA) 18 MCG inhalation capsule Place 18 mcg into inhaler and inhale daily.        . [DISCONTINUED] fluticasone (VERAMYST) 27.5 MCG/SPRAY nasal spray Place 2 sprays into the nose daily.       No current facility-administered medications on file prior to visit.    Current Problems (verified) Patient Active Problem List   Diagnosis Date Noted  . Abdominal aneurysm without mention of rupture 01/19/2014  . Vitamin D deficiency 08/27/2013  . Medication management 08/27/2013  . Prediabetes   . Hyperlipidemia   . Hypertension   . COPD (chronic obstructive pulmonary disease)   . GERD (gastroesophageal reflux disease)   . Depression   . BPH (benign prostatic hyperplasia)     Screening Tests Health Maintenance  Topic Date Due  . Tetanus/tdap  12/22/1958  . Zostavax  12/22/1999  . Colonoscopy  06/12/2011  . Influenza Vaccine  01/09/2014  . Pneumococcal Polysaccharide Vaccine Age 74 And Over  Completed    Immunization History  Administered Date(s) Administered  . DT 01/05/2004  . Influenza Split 02/24/2013  . Influenza-Unspecified 03/11/2014  . Pneumococcal Polysaccharide-23 01/21/2007    Preventative care: Last colonoscopy: 2003  (declines f/u)  Prior vaccinations: DT : 2005  Influenza: HD 03/11/2014  Pneumococcal: 2008 Shingles/Zostavax: declined  History reviewed: allergies, current medications, past family history, past medical history, past social history, past surgical history and problem list  Risk Factors: Tobacco History  Substance Use Topics  . Smoking status: Former Smoker    Types: Cigarettes    Quit date: 06/11/1992  . Smokeless tobacco: Never Used  . Alcohol Use: No   He does not smoke .  Patient is a former smoker. Are there smokers in your home (other than you)?  No  Alcohol Current alcohol use: none  Caffeine Current caffeine use: coffee 2-3 /day  Exercise Current exercise: walking  Nutrition/Diet Current diet: in general, a "healthy" diet    Cardiac risk factors: advanced age (older than 80 for men, 21 for women), diabetes mellitus, dyslipidemia, hypertension, male gender, obesity (BMI >= 30 kg/m2) and sedentary lifestyle.  Depression Screen (Note: if answer to either of the following is "Yes", a more complete depression screening is indicated)   Q1: Over the past two weeks, have you felt down, depressed or hopeless? No  Q2:  Over the past two weeks, have you felt little interest or pleasure in doing things? No  Have you lost interest or pleasure in daily life? No  Do you often feel hopeless? No  Do you cry easily over simple problems? No  Activities of Daily Living In your present state of health, do you have any difficulty performing the following activities?:  Driving? No Managing money?  No Feeding yourself? No Getting from bed to chair? No Climbing a flight of stairs? No Preparing food and eating?: No Bathing or showering? No Getting dressed: No Getting to the toilet? No Using the toilet:No Moving around from place to place: No In the past year have you fallen or had a near fall?:No   Are you sexually active?  Yes  Do you have more than one partner?   No  Vision Difficulties: No  Hearing Difficulties: No Do you often ask people to speak up or repeat themselves? No Do you experience ringing or noises in your ears? No Do you have difficulty understanding soft or whispered voices? No  Cognition  Do you feel that you have a problem with memory?No  Do you often misplace items? No  Do you feel safe at home?  Yes  Advanced directives Does patient have a [place]? Yes Does patient have a Living Will? Yes   Objective:     BlP 120/72, pulse 88, temp 97.7 F , resp 16, height 6' 0.5" , weight 209 lb 3.2 oz  BMI  27.97   General appearance: alert, no distress, WD/WN, male Cognitive Testing  Alert? Yes  Normal Appearance? Yes  Oriented to person? Yes  Place? Yes   Time? Yes  Recall of three objects?  Yes  Can perform simple calculations? Yes  Displays appropriate judgment? Yes  Can read the correct time from a watch/clock? Yes  HEENT: normocephalic, sclerae anicteric, TMs pearly, nares patent, no discharge or erythema, pharynx normal Oral cavity: MMM, no lesions Neck: supple, no lymphadenopathy, no thyromegaly, no masses Heart: RRR, normal S1, S2, no murmurs Lungs: CTA bilaterally, no wheezes, rhonchi, or rales Abdomen: +bs, soft, non tender, non distended, no masses, no hepatomegaly, no splenomegaly Musculoskeletal: nontender, no swelling, no obvious deformity Extremities: no edema, no cyanosis, no clubbing Pulses: 2+ symmetric, upper and lower extremities, normal cap refill Neurological: alert, oriented x 3, CN2-12 intact, strength normal upper extremities and lower extremities, sensation normal throughout, DTRs 2+ throughout, no cerebellar signs, gait normal Psychiatric: normal affect, behavior normal, pleasant   Medicare Attestation I have personally reviewed: The patient's medical and social history Their use of alcohol, tobacco or illicit drugs Their current medications and supplements The  patient's functional ability including ADLs,fall risks, home safety risks, cognitive, and hearing and visual impairment Diet and physical activities Evidence for depression or mood disorders  The patient's weight, height, BMI, and visual acuity have been recorded in the chart.  I have made referrals, counseling, and provided education to the patient based on review of the above and I have provided the patient with a written personalized care plan for preventive services.    Walter Farrel DAVID, MD   03/25/2014

## 2014-03-26 LAB — HEPATIC FUNCTION PANEL
ALBUMIN: 3.9 g/dL (ref 3.5–5.2)
ALT: 10 U/L (ref 0–53)
AST: 13 U/L (ref 0–37)
Alkaline Phosphatase: 84 U/L (ref 39–117)
BILIRUBIN DIRECT: 0.1 mg/dL (ref 0.0–0.3)
BILIRUBIN TOTAL: 0.5 mg/dL (ref 0.2–1.2)
Indirect Bilirubin: 0.4 mg/dL (ref 0.2–1.2)
Total Protein: 6.6 g/dL (ref 6.0–8.3)

## 2014-03-26 LAB — LIPID PANEL
Cholesterol: 165 mg/dL (ref 0–200)
HDL: 37 mg/dL — AB (ref >39)
LDL Cholesterol: 97 mg/dL (ref 0–99)
TRIGLYCERIDES: 156 mg/dL — AB (ref <150)
Total CHOL/HDL Ratio: 4.5 Ratio
VLDL: 31 mg/dL (ref 0–40)

## 2014-03-26 LAB — BASIC METABOLIC PANEL WITH GFR
BUN: 14 mg/dL (ref 6–23)
CALCIUM: 9.3 mg/dL (ref 8.4–10.5)
CO2: 28 mEq/L (ref 19–32)
CREATININE: 0.79 mg/dL (ref 0.50–1.35)
Chloride: 99 mEq/L (ref 96–112)
GFR, Est African American: >89 mL/min
GFR, Est Non African American: 88 mL/min
Glucose, Bld: 165 mg/dL — ABNORMAL HIGH (ref 70–99)
Potassium: 3.7 mEq/L (ref 3.5–5.3)
SODIUM: 138 meq/L (ref 135–145)

## 2014-03-26 LAB — VITAMIN D 25 HYDROXY (VIT D DEFICIENCY, FRACTURES): VIT D 25 HYDROXY: 54 ng/mL (ref 30–89)

## 2014-03-26 LAB — TSH: TSH: 1.144 u[IU]/mL (ref 0.350–4.500)

## 2014-03-26 LAB — MAGNESIUM: MAGNESIUM: 1.8 mg/dL (ref 1.5–2.5)

## 2014-03-26 LAB — INSULIN, FASTING: INSULIN FASTING, SERUM: 52.7 u[IU]/mL — AB (ref 2.0–19.6)

## 2014-03-29 ENCOUNTER — Other Ambulatory Visit: Payer: Self-pay | Admitting: Internal Medicine

## 2014-06-22 ENCOUNTER — Other Ambulatory Visit: Payer: Self-pay | Admitting: *Deleted

## 2014-06-22 DIAGNOSIS — G4700 Insomnia, unspecified: Secondary | ICD-10-CM

## 2014-06-22 MED ORDER — ALPRAZOLAM 1 MG PO TABS: 1.0000 mg | ORAL_TABLET | Freq: Every evening | ORAL | Status: DC | PRN

## 2014-07-07 ENCOUNTER — Ambulatory Visit: Payer: Self-pay | Admitting: Physician Assistant

## 2014-07-22 ENCOUNTER — Ambulatory Visit (INDEPENDENT_AMBULATORY_CARE_PROVIDER_SITE_OTHER): Payer: MEDICARE | Admitting: Physician Assistant

## 2014-07-22 ENCOUNTER — Other Ambulatory Visit: Payer: Self-pay

## 2014-07-22 ENCOUNTER — Encounter: Payer: Self-pay | Admitting: Physician Assistant

## 2014-07-22 ENCOUNTER — Ambulatory Visit (HOSPITAL_COMMUNITY)
Admission: RE | Admit: 2014-07-22 | Discharge: 2014-07-22 | Disposition: A | Discharge status: Home / Self Care | Payer: MEDICARE | Source: Ambulatory Visit | Attending: Physician Assistant | Admitting: Physician Assistant

## 2014-07-22 VITALS — BP 120/80 | HR 80 | Temp 97.9°F | Resp 16 | Ht 72.0 in | Wt 209.0 lb

## 2014-07-22 DIAGNOSIS — E785 Hyperlipidemia, unspecified: Secondary | ICD-10-CM

## 2014-07-22 DIAGNOSIS — R7303 Prediabetes: Secondary | ICD-10-CM

## 2014-07-22 DIAGNOSIS — I714 Abdominal aortic aneurysm, without rupture, unspecified: Secondary | ICD-10-CM

## 2014-07-22 DIAGNOSIS — R7309 Other abnormal glucose: Secondary | ICD-10-CM

## 2014-07-22 DIAGNOSIS — J449 Chronic obstructive pulmonary disease, unspecified: Secondary | ICD-10-CM

## 2014-07-22 DIAGNOSIS — F329 Major depressive disorder, single episode, unspecified: Secondary | ICD-10-CM

## 2014-07-22 DIAGNOSIS — I10 Essential (primary) hypertension: Secondary | ICD-10-CM

## 2014-07-22 DIAGNOSIS — R0600 Dyspnea, unspecified: Secondary | ICD-10-CM

## 2014-07-22 DIAGNOSIS — R079 Chest pain, unspecified: Secondary | ICD-10-CM | POA: Insufficient documentation

## 2014-07-22 DIAGNOSIS — Z79899 Other long term (current) drug therapy: Secondary | ICD-10-CM

## 2014-07-22 DIAGNOSIS — G4700 Insomnia, unspecified: Secondary | ICD-10-CM

## 2014-07-22 DIAGNOSIS — F32A Depression, unspecified: Secondary | ICD-10-CM

## 2014-07-22 DIAGNOSIS — E559 Vitamin D deficiency, unspecified: Secondary | ICD-10-CM

## 2014-07-22 LAB — BASIC METABOLIC PANEL WITH GFR
BUN: 17 mg/dL (ref 6–23)
CALCIUM: 9.4 mg/dL (ref 8.4–10.5)
CO2: 29 meq/L (ref 19–32)
Chloride: 99 mEq/L (ref 96–112)
Creat: 1.08 mg/dL (ref 0.50–1.35)
GFR, EST AFRICAN AMERICAN: 78 mL/min
GFR, Est Non African American: 67 mL/min
GLUCOSE: 85 mg/dL (ref 70–99)
POTASSIUM: 4 meq/L (ref 3.5–5.3)
SODIUM: 139 meq/L (ref 135–145)

## 2014-07-22 LAB — CBC WITH DIFFERENTIAL/PLATELET
Basophils Absolute: 0 10*3/uL (ref 0.0–0.1)
Basophils Relative: 0 % (ref 0–1)
Eosinophils Absolute: 0.2 10*3/uL (ref 0.0–0.7)
Eosinophils Relative: 2 % (ref 0–5)
HCT: 42.5 % (ref 39.0–52.0)
HEMOGLOBIN: 14.4 g/dL (ref 13.0–17.0)
LYMPHS ABS: 1.6 10*3/uL (ref 0.7–4.0)
LYMPHS PCT: 18 % (ref 12–46)
MCH: 31.2 pg (ref 26.0–34.0)
MCHC: 33.9 g/dL (ref 30.0–36.0)
MCV: 92.2 fL (ref 78.0–100.0)
MONOS PCT: 9 % (ref 3–12)
MPV: 10.1 fL (ref 8.6–12.4)
Monocytes Absolute: 0.8 10*3/uL (ref 0.1–1.0)
NEUTROS PCT: 71 % (ref 43–77)
Neutro Abs: 6.5 10*3/uL (ref 1.7–7.7)
Platelets: 290 10*3/uL (ref 150–400)
RBC: 4.61 MIL/uL (ref 4.22–5.81)
RDW: 13.2 % (ref 11.5–15.5)
WBC: 9.1 10*3/uL (ref 4.0–10.5)

## 2014-07-22 LAB — MAGNESIUM: Magnesium: 1.9 mg/dL (ref 1.5–2.5)

## 2014-07-22 LAB — HEPATIC FUNCTION PANEL
ALBUMIN: 4 g/dL (ref 3.5–5.2)
ALT: 11 U/L (ref 0–53)
AST: 14 U/L (ref 0–37)
Alkaline Phosphatase: 89 U/L (ref 39–117)
BILIRUBIN DIRECT: 0.1 mg/dL (ref 0.0–0.3)
BILIRUBIN INDIRECT: 0.4 mg/dL (ref 0.2–1.2)
Total Bilirubin: 0.5 mg/dL (ref 0.2–1.2)
Total Protein: 7.1 g/dL (ref 6.0–8.3)

## 2014-07-22 LAB — LIPID PANEL
CHOLESTEROL: 232 mg/dL — AB (ref 0–200)
HDL: 38 mg/dL — ABNORMAL LOW (ref >39)
LDL CALC: 137 mg/dL — AB (ref 0–99)
Total CHOL/HDL Ratio: 6.1 Ratio
Triglycerides: 283 mg/dL — ABNORMAL HIGH (ref <150)
VLDL: 57 mg/dL — AB (ref 0–40)

## 2014-07-22 MED ORDER — CITALOPRAM HYDROBROMIDE 40 MG PO TABS: 40.0000 mg | ORAL_TABLET | Freq: Every day | ORAL | Status: DC

## 2014-07-22 MED ORDER — DOXAZOSIN MESYLATE 8 MG PO TABS
8.0000 mg | ORAL_TABLET | Freq: Every day | ORAL | Status: DC
Start: 2014-07-22 — End: 2015-09-05

## 2014-07-22 MED ORDER — TAMSULOSIN HCL 0.4 MG PO CAPS: 0.4000 mg | ORAL_CAPSULE | Freq: Every morning | ORAL | Status: DC

## 2014-07-22 MED ORDER — AZITHROMYCIN 250 MG PO TABS: ORAL_TABLET | ORAL | Status: DC

## 2014-07-22 MED ORDER — PREDNISONE 20 MG PO TABS: ORAL_TABLET | ORAL | Status: DC

## 2014-07-22 MED ORDER — ALPRAZOLAM 1 MG PO TABS
1.0000 mg | ORAL_TABLET | Freq: Three times a day (TID) | ORAL | Status: DC | PRN
Start: 2014-07-22 — End: 2014-12-15

## 2014-07-22 MED ORDER — HYDROCODONE-ACETAMINOPHEN 5-325 MG PO TABS: 1.0000 | ORAL_TABLET | Freq: Four times a day (QID) | ORAL | Status: DC | PRN

## 2014-07-22 MED ORDER — FLUTICASONE PROPIONATE 50 MCG/ACT NA SUSP: 1.0000 | Freq: Every day | NASAL | Status: DC

## 2014-07-22 NOTE — Patient Instructions (Signed)
Abdominal Aortic Aneurysm An aneurysm is a weakened or damaged part of an artery wall that bulges from the normal force of blood pumping through the body. An abdominal aortic aneurysm is an aneurysm that occurs in the lower part of the aorta, the main artery of the body.  The major concern with an abdominal aortic aneurysm is that it can enlarge and burst (rupture) or blood can flow between the layers of the wall of the aorta through a tear (aorticdissection). Both of these conditions can cause bleeding inside the body and can be life threatening unless diagnosed and treated promptly. CAUSES  The exact cause of an abdominal aortic aneurysm is unknown. Some contributing factors are:   A hardening of the arteries caused by the buildup of fat and other substances in the lining of a blood vessel (arteriosclerosis).  Inflammation of the walls of an artery (arteritis).   Connective tissue diseases, such as Marfan syndrome.   Abdominal trauma.   An infection, such as syphilis or staphylococcus, in the wall of the aorta (infectious aortitis) caused by bacteria. RISK FACTORS  Risk factors that contribute to an abdominal aortic aneurysm may include:  Age older than 40 years.   High blood pressure (hypertension).  Male gender.  Ethnicity (white race).  Obesity.  Family history of aneurysm (first degree relatives only).  Tobacco use. PREVENTION  The following healthy lifestyle habits may help decrease your risk of abdominal aortic aneurysm:  Quitting smoking. Smoking can raise your blood pressure and cause arteriosclerosis.  Limiting or avoiding alcohol.  Keeping your blood pressure, blood sugar level, and cholesterol levels within normal limits.  Decreasing your salt intake. In somepeople, too much salt can raise blood pressure and increase your risk of abdominal aortic aneurysm.  Eating a diet low in saturated fats and cholesterol.  Increasing your fiber intake by including  whole grains, vegetables, and fruits in your diet. Eating these foods may help lower blood pressure.  Maintaining a healthy weight.  Staying physically active and exercising regularly. SYMPTOMS  The symptoms of abdominal aortic aneurysm may vary depending on the size and rate of growth of the aneurysm.Most grow slowly and do not have any symptoms. When symptoms do occur, they may include:  Pain (abdomen, side, lower back, or groin). The pain may vary in intensity. A sudden onset of severe pain may indicate that the aneurysm has ruptured.  Feeling full after eating only small amounts of food.  Nausea or vomiting or both.  Feeling a pulsating lump in the abdomen.  Feeling faint or passing out. DIAGNOSIS  Since most unruptured abdominal aortic aneurysms have no symptoms, they are often discovered during diagnostic exams for other conditions. An aneurysm may be found during the following procedures:  Ultrasonography (A one-time screening for abdominal aortic aneurysm by ultrasonography is also recommended for all men aged 33-75 years who have ever smoked).  X-ray exams.  A computed tomography (CT).  Magnetic resonance imaging (MRI).  Angiography or arteriography. TREATMENT  Treatment of an abdominal aortic aneurysm depends on the size of your aneurysm, your age, and risk factors for rupture. Medication to control blood pressure and pain may be used to manage aneurysms smaller than 6 cm. Regular monitoring for enlargement may be recommended by your caregiver if: 1. The aneurysm is 3-4 cm in size (an annual ultrasonography may be recommended). 2. The aneurysm is 4-4.5 cm in size (an ultrasonography every 6 months may be recommended). 3. The aneurysm is larger than 4.5 cm in  size (your caregiver may ask that you be examined by a vascular surgeon). If your aneurysm is larger than 6 cm, surgical repair may be recommended. There are two main methods for repair of an aneurysm:    Endovascular repair (a minimally invasive surgery). This is done most often.  Open repair. This method is used if an endovascular repair is not possible. Document Released: 03/07/2005 Document Revised: 09/22/2012 Document Reviewed: 06/27/2012 [facility] Patient Information 2015 [place], [place]. This information is not intended to replace advice given to you by your health care provider. Make sure you discuss any questions you have with your health care provider.  Chronic Obstructive Pulmonary Disease Chronic obstructive pulmonary disease (COPD) is a common lung condition in which airflow from the lungs is limited. COPD is a general term that can be used to describe many different lung problems that limit airflow, including both chronic bronchitis and emphysema. If you have COPD, your lung function will probably never return to normal, but there are measures you can take to improve lung function and make yourself feel better.  CAUSES   Smoking (common).   Exposure to secondhand smoke.   Genetic problems.  Chronic inflammatory lung diseases or recurrent infections. SYMPTOMS   Shortness of breath, especially with physical activity.   Deep, persistent (chronic) cough with a large amount of thick mucus.   Wheezing.   Rapid breaths (tachypnea).   Gray or bluish discoloration (cyanosis) of the skin, especially in fingers, toes, or lips.   Fatigue.   Weight loss.   Frequent infections or episodes when breathing symptoms become much worse (exacerbations).   Chest tightness. DIAGNOSIS  Your health care provider will take a medical history and perform a physical examination to make the initial diagnosis. Additional tests for COPD may include:   Lung (pulmonary) function tests.  Chest X-ray.  CT scan.  Blood tests. TREATMENT  Treatment available to help you feel better when you have COPD includes:   Inhaler and nebulizer medicines. These help manage the symptoms of  COPD and make your breathing more comfortable.  Supplemental oxygen. Supplemental oxygen is only helpful if you have a low oxygen level in your blood.   Exercise and physical activity. These are beneficial for nearly all people with COPD. Some people may also benefit from a pulmonary rehabilitation program. HOME CARE INSTRUCTIONS   Take all medicines (inhaled or pills) as directed by your health care provider.  Avoid over-the-counter medicines or cough syrups that dry up your airway (such as antihistamines) and slow down the elimination of secretions unless instructed otherwise by your health care provider.   If you are a smoker, the most important thing that you can do is stop smoking. Continuing to smoke will cause further lung damage and breathing trouble. Ask your health care provider for help with quitting smoking. He or she can direct you to community resources or hospitals that provide support.  Avoid exposure to irritants such as smoke, chemicals, and fumes that aggravate your breathing.  Use oxygen therapy and pulmonary rehabilitation if directed by your health care provider. If you require home oxygen therapy, ask your health care provider whether you should purchase a pulse oximeter to measure your oxygen level at home.   Avoid contact with individuals who have a contagious illness.  Avoid extreme temperature and humidity changes.  Eat healthy foods. Eating smaller, more frequent meals and resting before meals may help you maintain your strength.  Stay active, but balance activity with periods of rest.  Exercise and physical activity will help you maintain your ability to do things you want to do.  Preventing infection and hospitalization is very important when you have COPD. Make sure to receive all the vaccines your health care provider recommends, especially the pneumococcal and influenza vaccines. Ask your health care provider whether you need a pneumonia vaccine.  Learn  and use relaxation techniques to manage stress.  Learn and use controlled breathing techniques as directed by your health care provider. Controlled breathing techniques include:   Pursed lip breathing. Start by breathing in (inhaling) through your nose for 1 second. Then, purse your lips as if you were going to whistle and breathe out (exhale) through the pursed lips for 2 seconds.   Diaphragmatic breathing. Start by putting one hand on your abdomen just above your waist. Inhale slowly through your nose. The hand on your abdomen should move out. Then purse your lips and exhale slowly. You should be able to feel the hand on your abdomen moving in as you exhale.   Learn and use controlled coughing to clear mucus from your lungs. Controlled coughing is a series of short, progressive coughs. The steps of controlled coughing are:  4. Lean your head slightly forward.  5. Breathe in deeply using diaphragmatic breathing.  6. Try to hold your breath for 3 seconds.  7. Keep your mouth slightly open while coughing twice.  8. Spit any mucus out into a tissue.  9. Rest and repeat the steps once or twice as needed. SEEK MEDICAL CARE IF:   You are coughing up more mucus than usual.   There is a change in the color or thickness of your mucus.   Your breathing is more labored than usual.   Your breathing is faster than usual.  SEEK IMMEDIATE MEDICAL CARE IF:   You have shortness of breath while you are resting.   You have shortness of breath that prevents you from:  Being able to talk.   Performing your usual physical activities.   You have chest pain lasting longer than 5 minutes.   Your skin color is more cyanotic than usual.  You measure low oxygen saturations for longer than 5 minutes with a pulse oximeter. MAKE SURE YOU:   Understand these instructions.  Will watch your condition.  Will get help right away if you are not doing well or get worse. Document Released:  03/07/2005 Document Revised: 10/12/2013 Document Reviewed: 01/22/2013 [facility] Patient Information 2015 [place], [place]. This information is not intended to replace advice given to you by your health care provider. Make sure you discuss any questions you have with your health care provider.

## 2014-07-22 NOTE — Progress Notes (Signed)
Assessment and Plan:  Hypertension: Continue medication, monitor blood pressure at home. Continue DASH diet.  Reminder to go to the ER if any CP, SOB, nausea, dizziness, severe HA, changes vision/speech, left arm numbness and tingling, and jaw pain. Cholesterol: Continue diet and exercise. Check cholesterol.  Pre-diabetes-Continue diet and exercise. Check A1C Vitamin D Def- check level and continue medications.  COPD- will treat with zpak, continue breathing treatments, prednisone Fall- no LOC, get Xray CXR, left knee Chest pain-? From fall, CAD, COPD-EKG with no significant ST changes- get CXR, treat COPD AAA s/p fall, normal BP bilateral arms/legs, + tenderness AB without radiation to back. May need evaluation, declines at this time, will go to ER if worsening pain, SOB, CP.   Continue diet and meds as discussed. Further disposition pending results of labs. OVER 40 minutes of exam, counseling, chart review, referral performed   HPI 74 y.o. male  presents for 3 month follow up with hypertension, hyperlipidemia, prediabetes and vitamin D.  His blood pressure has been controlled at home, today their BP is BP: 120/80 mmHg. He is being monitored for stable AAA, 5.6cm, follows with Dr. Donnetta Hutching.    He does not workout. He denies dizziness. He has been having SOB and having some chest pain since his fall. He is also having chest pressure diffuse across his chest after the fall, worse with exertion, will last 20-30 mins after rest, he is sore there from falling, tylenol and heating pad helps. Denies radiation, or other accompaniments. Not pleurtic pain.   The patient is currently having symptoms / an exacerbation. Current symptoms include acute dyspnea, cough productive of clear sputum in small amounts, wheezing and no fever, he has been taking claritin D which has helped some.. Symptoms have been present since a week ago and have been gradually worsening. He denies weight increase, chills and fever.  Associated symptoms include poor exercise tolerance, shortness of breath and wheezing.  This episode appears to have been triggered by cold air and upper respiratory infection. Treatments tried for the current exacerbation: salmeterol and claritin D.   Fell two weeks ago on the ice, he continues to have back pain, left knee pain. He states it is keeping him up, he has been using heating pad.    He is on cholesterol medication and denies myalgias. His cholesterol is not at goal. The cholesterol last visit was:   Lab Results  Component Value Date   CHOL 165 03/25/2014   HDL 37* 03/25/2014   LDLCALC 97 03/25/2014   TRIG 156* 03/25/2014   CHOLHDL 4.5 03/25/2014    He has been working on diet and exercise for prediabetes, and denies paresthesia of the feet, polydipsia, polyuria and visual disturbances. Last A1C in the office was:  Lab Results  Component Value Date   HGBA1C 5.8* 03/25/2014   Patient is on Vitamin D supplement.   Lab Results  Component Value Date   VD25OH 26 03/25/2014     He has been having trouble with urinary, he is on flomax and oxybutynin.  He is on celexa for depression which helps and he is on xanax 1-3 a day.    Current Medications:  Current Outpatient Prescriptions on File Prior to Visit  Medication Sig Dispense Refill  . ALPRAZolam (XANAX) 1 MG tablet Take 1 tablet (1 mg total) by mouth at bedtime as needed. For sleep 90 tablet 0  . cholecalciferol (VITAMIN D) 1000 UNITS tablet Take 1,000 Units by mouth daily.    Marland Kitchen  citalopram (CELEXA) 40 MG tablet Take 1 tablet (40 mg total) by mouth daily. FOR Mood 90 tablet 1  . doxazosin (CARDURA) 8 MG tablet Take 1 tablet (8 mg total) by mouth daily. For prostate and blood pressure 90 tablet 1  . ezetimibe (ZETIA) 10 MG tablet Take 10 mg by mouth daily.    . fluticasone (FLONASE) 50 MCG/ACT nasal spray Place 1 spray into both nostrils daily. 16 g 6  . Fluticasone-Salmeterol (ADVAIR DISKUS IN) Inhale into the lungs once. One  inhalation 9 am    . HYDROcodone-acetaminophen (NORCO/VICODIN) 5-325 MG per tablet     . lisinopril-hydrochlorothiazide (PRINZIDE,ZESTORETIC) 20-25 MG per tablet Take 1 tablet by mouth daily.    Marland Kitchen loratadine-pseudoephedrine (CLARITIN-D 12-HOUR) 5-120 MG per tablet Take 1 tablet by mouth 2 (two) times daily.    . Multiple Vitamin (MULTIVITAMIN) capsule Take 1 capsule by mouth daily.      Marland Kitchen oxybutynin (DITROPAN) 5 MG tablet Take 1 tablet 3 x day as needed for Overactive Bladder 90 tablet 5  . tamsulosin (FLOMAX) 0.4 MG CAPS capsule Take 1 capsule (0.4 mg total) by mouth every morning. For prostate 90 capsule 1  . testosterone cypionate (DEPO-TESTOSTERONE) 200 MG/ML injection Inject 2 cc intra muscular every 2 weeks and 3 cc syringes and 1 inch 21 G needles 10 mL 3  . tiotropium (SPIRIVA) 18 MCG inhalation capsule Place 18 mcg into inhaler and inhale daily.      . [DISCONTINUED] fluticasone (VERAMYST) 27.5 MCG/SPRAY nasal spray Place 2 sprays into the nose daily.     No current facility-administered medications on file prior to visit.   Medical History:  Past Medical History  Diagnosis Date  . Bronchitis   . Depression with anxiety   . AAA (abdominal aortic aneurysm)   . Asthma   . Prediabetes   . Hyperlipidemia   . Hypertension   . COPD (chronic obstructive pulmonary disease)   . GERD (gastroesophageal reflux disease)   . Hiatal hernia   . Arthritis   . DDD (degenerative disc disease)   . Depression   . BPH (benign prostatic hyperplasia)   . Aneurysm   . Cancer Dec. 12, 2014    [facility]- [facility]   Allergies: No Known Allergies   Review of Systems:  Review of Systems  Constitutional: Positive for weight loss and malaise/fatigue. Negative for fever, chills and diaphoresis.  HENT: Negative.   Respiratory: Positive for cough, sputum production, shortness of breath and wheezing. Negative for hemoptysis.   Cardiovascular: Positive for chest pain. Negative for palpitations, orthopnea,  claudication, leg swelling and PND.  Gastrointestinal: Positive for nausea and abdominal pain. Negative for heartburn, vomiting, diarrhea, constipation, blood in stool and melena.  Genitourinary: Negative.   Musculoskeletal: Negative.   Skin: Negative.  Negative for rash.  Neurological: Negative.  Negative for weakness.  Psychiatric/Behavioral: Negative.    Family history- Review and unchanged Social history- Review and unchanged Physical Exam: BP 120/80 mmHg  Pulse 80  Temp(Src) 97.9 F (36.6 C)  Resp 16  Ht 6' (1.829 m)  Wt 209 lb (94.802 kg)  BMI 28.34 kg/m2 Wt Readings from Last 3 Encounters:  07/22/14 209 lb (94.802 kg)  03/25/14 209 lb 3.2 oz (94.892 kg)  01/19/14 202 lb 14.4 oz (92.035 kg)   General Appearance: Well nourished, in no apparent distress. Eyes: PERRLA, EOMs, conjunctiva no swelling or erythema Sinuses: No Frontal/maxillary tenderness ENT/Mouth: Ext aud canals clear, TMs without erythema, bulging. No erythema, swelling, or exudate on post  pharynx.  Tonsils not swollen or erythematous. Hearing normal.  Neck: Supple, thyroid normal.  Respiratory: Respiratory effort normal, O2 at 96% RA, abnormal decreased BS left lung to mid lung with expiratory "gurgle/rales"  Cardio: Distant HR with RRR with occ PVC. Brisk peripheral pulses without edema.  Abdomen: Soft, + BS,  + diffuse tendernes worse epigastric, + ventral hernia, no guarding, rebound, masses. Lymphatics: Non tender without lymphadenopathy.  Musculoskeletal: Full ROM, 5/5 strength, Normal gait Skin: Warm, dry without rashes, lesions, ecchymosis.  Neuro: Cranial nerves intact. Normal muscle tone, no cerebellar symptoms. Psych: Awake and oriented X 3, normal affect, Insight and Judgment appropriate.    Vicie Mutters, PA-C 11:33 AM [facility]

## 2014-07-23 LAB — HEMOGLOBIN A1C
HEMOGLOBIN A1C: 5.8 % — AB (ref <5.7)
Mean Plasma Glucose: 120 mg/dL — ABNORMAL HIGH (ref <117)

## 2014-07-23 LAB — VITAMIN D 25 HYDROXY (VIT D DEFICIENCY, FRACTURES): Vit D, 25-Hydroxy: 37 ng/mL (ref 30–100)

## 2014-07-23 LAB — TSH: TSH: 1.996 u[IU]/mL (ref 0.350–4.500)

## 2014-07-28 ENCOUNTER — Other Ambulatory Visit: Payer: Self-pay | Admitting: Physician Assistant

## 2014-07-28 MED ORDER — LEVOFLOXACIN 500 MG PO TABS: 500.0000 mg | ORAL_TABLET | Freq: Every day | ORAL | Status: DC

## 2014-08-03 ENCOUNTER — Other Ambulatory Visit (HOSPITAL_COMMUNITY): Payer: MEDICARE

## 2014-08-03 ENCOUNTER — Ambulatory Visit: Payer: MEDICARE | Admitting: Vascular Surgery

## 2014-08-09 ENCOUNTER — Encounter: Payer: Self-pay | Admitting: Vascular Surgery

## 2014-08-10 ENCOUNTER — Ambulatory Visit (HOSPITAL_COMMUNITY)
Admission: RE | Admit: 2014-08-10 | Discharge: 2014-08-10 | Disposition: A | Discharge status: Home / Self Care | Payer: MEDICARE | Source: Ambulatory Visit | Attending: Vascular Surgery | Admitting: Vascular Surgery

## 2014-08-10 ENCOUNTER — Encounter: Payer: Self-pay | Admitting: Vascular Surgery

## 2014-08-10 ENCOUNTER — Ambulatory Visit (INDEPENDENT_AMBULATORY_CARE_PROVIDER_SITE_OTHER): Payer: MEDICARE | Admitting: Vascular Surgery

## 2014-08-10 VITALS — BP 144/91 | HR 84 | Resp 18 | Ht 73.0 in | Wt 205.0 lb

## 2014-08-10 DIAGNOSIS — I714 Abdominal aortic aneurysm, without rupture, unspecified: Secondary | ICD-10-CM

## 2014-08-10 NOTE — Progress Notes (Signed)
Patient presents today for continued follow-up of his known  Abdominal aortic aneurysm. He reports that he has had 2 recent falls causing him to have some back discomfort but no major injury with this. He denies any symptoms of aneurysm. He denies any recent cardiac difficulties.  Past Medical History  Diagnosis Date  . Bronchitis   . Depression with anxiety   . AAA (abdominal aortic aneurysm)   . Asthma   . Prediabetes   . Hyperlipidemia   . Hypertension   . COPD (chronic obstructive pulmonary disease)   . GERD (gastroesophageal reflux disease)   . Hiatal hernia   . Arthritis   . DDD (degenerative disc disease)   . Depression   . BPH (benign prostatic hyperplasia)   . Aneurysm   . Cancer Dec. 12, 2014    [facility]    History  Substance Use Topics  . Smoking status: Former Smoker    Types: Cigarettes    Quit date: 06/11/1992  . Smokeless tobacco: Never Used  . Alcohol Use: No    Family History  Problem Relation Age of Onset  . Other Sister     AAA  . Heart disease Mother   . Diabetes Mother   . Heart attack Mother   . Heart disease Father   . Cancer Brother   . Diabetes Brother   . Heart disease Brother     before age 68  . Varicose Veins Brother   . Heart attack Brother   . Heart disease Son     before age 16    No Known Allergies   Current outpatient prescriptions:  .  cholecalciferol (VITAMIN D) 1000 UNITS tablet, Take 1,000 Units by mouth daily., Disp: , Rfl:  .  citalopram (CELEXA) 40 MG tablet, Take 1 tablet (40 mg total) by mouth daily. FOR Mood, Disp: 90 tablet, Rfl: 3 .  doxazosin (CARDURA) 8 MG tablet, Take 1 tablet (8 mg total) by mouth daily. For prostate and blood pressure, Disp: 90 tablet, Rfl: 3 .  fluticasone (FLONASE) 50 MCG/ACT nasal spray, Place 1 spray into both nostrils daily., Disp: 16 g, Rfl: 6 .  Fluticasone-Salmeterol (ADVAIR DISKUS IN), Inhale into the lungs once. One inhalation 9 am, Disp: , Rfl:  .  HYDROcodone-acetaminophen  (NORCO/VICODIN) 5-325 MG per tablet, Take 1 tablet by mouth every 6 (six) hours as needed for moderate pain., Disp: 90 tablet, Rfl: 0 .  lisinopril-hydrochlorothiazide (PRINZIDE,ZESTORETIC) 20-25 MG per tablet, Take 1 tablet by mouth daily., Disp: , Rfl:  .  loratadine-pseudoephedrine (CLARITIN-D 12-HOUR) 5-120 MG per tablet, Take 1 tablet by mouth 2 (two) times daily., Disp: , Rfl:  .  Multiple Vitamin (MULTIVITAMIN) capsule, Take 1 capsule by mouth daily.  , Disp: , Rfl:  .  oxybutynin (DITROPAN) 5 MG tablet, Take 1 tablet 3 x day as needed for Overactive Bladder, Disp: 90 tablet, Rfl: 5 .  predniSONE (DELTASONE) 20 MG tablet, 2 tablets daily for 3 days, 1 tablet daily for 4 days., Disp: 10 tablet, Rfl: 0 .  tamsulosin (FLOMAX) 0.4 MG CAPS capsule, Take 1 capsule (0.4 mg total) by mouth every morning. For prostate, Disp: 90 capsule, Rfl: 3 .  testosterone cypionate (DEPO-TESTOSTERONE) 200 MG/ML injection, Inject 2 cc intra muscular every 2 weeks and 3 cc syringes and 1 inch 21 G needles, Disp: 10 mL, Rfl: 3 .  tiotropium (SPIRIVA) 18 MCG inhalation capsule, Place 18 mcg into inhaler and inhale daily.  , Disp: , Rfl:  .  ALPRAZolam (XANAX) 1 MG tablet, Take 1 tablet (1 mg total) by mouth 3 (three) times daily as needed. For sleep (Patient not taking: Reported on 08/10/2014), Disp: 90 tablet, Rfl: 3 .  ezetimibe (ZETIA) 10 MG tablet, Take 10 mg by mouth daily., Disp: , Rfl:  .  levofloxacin (LEVAQUIN) 500 MG tablet, Take 1 tablet (500 mg total) by mouth daily. (Patient not taking: Reported on 08/10/2014), Disp: 10 tablet, Rfl: 0 .  [DISCONTINUED] fluticasone (VERAMYST) 27.5 MCG/SPRAY nasal spray, Place 2 sprays into the nose daily., Disp: , Rfl:   BP 144/91 mmHg  Pulse 84  Resp 18  Ht 6\' 1"  (1.854 m)  Wt 205 lb (92.987 kg)  BMI 27.05 kg/m2  Body mass index is 27.05 kg/(m^2).        Physical exam well-developed well-nourished gentleman in no acute distress  Alert and oriented and  neurologically intact  Palpable femoral pulses bilaterally  Abdomen was moderate obesity. I do not palpate his aneurysm. No tenderness in his abdomen.   Ultrasound today was reviewed with the patient and his wife present. This reveals a 5.3 cm aneurysm. This is no change from the study on 01/19/2014   Impression and plan: 5.3 cm aneurysm. 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 Explained that he would be at much higher risk for repair is in observation currently. Explained his risk for rupture should be less than 5% per year. We will see him again in 6 months with continued surveillance.

## 2014-08-11 NOTE — Addendum Note (Signed)
Addended by: Mena Goes on: 08/11/2014 11:11 AM   Modules accepted: Orders

## 2014-09-27 ENCOUNTER — Other Ambulatory Visit: Payer: Self-pay | Admitting: *Deleted

## 2014-09-27 MED ORDER — TESTOSTERONE CYPIONATE 200 MG/ML IM SOLN: INTRAMUSCULAR | Status: DC

## 2014-10-29 ENCOUNTER — Encounter: Payer: Self-pay | Admitting: Internal Medicine

## 2014-12-15 ENCOUNTER — Other Ambulatory Visit: Payer: Self-pay | Admitting: Physician Assistant

## 2014-12-15 DIAGNOSIS — G4700 Insomnia, unspecified: Secondary | ICD-10-CM

## 2014-12-15 MED ORDER — ALPRAZOLAM 1 MG PO TABS: 1.0000 mg | ORAL_TABLET | Freq: Three times a day (TID) | ORAL | Status: DC | PRN

## 2014-12-16 ENCOUNTER — Encounter: Payer: Self-pay | Admitting: Internal Medicine

## 2014-12-16 ENCOUNTER — Ambulatory Visit (INDEPENDENT_AMBULATORY_CARE_PROVIDER_SITE_OTHER): Payer: MEDICARE | Admitting: Internal Medicine

## 2014-12-16 VITALS — BP 134/82 | HR 96 | Temp 97.7°F | Resp 16 | Ht 72.0 in | Wt 228.2 lb

## 2014-12-16 DIAGNOSIS — Z6831 Body mass index (BMI) 31.0-31.9, adult: Secondary | ICD-10-CM

## 2014-12-16 DIAGNOSIS — R7309 Other abnormal glucose: Secondary | ICD-10-CM

## 2014-12-16 DIAGNOSIS — E349 Endocrine disorder, unspecified: Secondary | ICD-10-CM

## 2014-12-16 DIAGNOSIS — E559 Vitamin D deficiency, unspecified: Secondary | ICD-10-CM

## 2014-12-16 DIAGNOSIS — J449 Chronic obstructive pulmonary disease, unspecified: Secondary | ICD-10-CM

## 2014-12-16 DIAGNOSIS — F32A Depression, unspecified: Secondary | ICD-10-CM

## 2014-12-16 DIAGNOSIS — E291 Testicular hypofunction: Secondary | ICD-10-CM

## 2014-12-16 DIAGNOSIS — R7303 Prediabetes: Secondary | ICD-10-CM

## 2014-12-16 DIAGNOSIS — Z79899 Other long term (current) drug therapy: Secondary | ICD-10-CM

## 2014-12-16 DIAGNOSIS — I10 Essential (primary) hypertension: Secondary | ICD-10-CM

## 2014-12-16 DIAGNOSIS — F329 Major depressive disorder, single episode, unspecified: Secondary | ICD-10-CM

## 2014-12-16 DIAGNOSIS — Z1331 Encounter for screening for depression: Secondary | ICD-10-CM

## 2014-12-16 DIAGNOSIS — E785 Hyperlipidemia, unspecified: Secondary | ICD-10-CM

## 2014-12-16 DIAGNOSIS — Z9181 History of falling: Secondary | ICD-10-CM

## 2014-12-16 DIAGNOSIS — N400 Enlarged prostate without lower urinary tract symptoms: Secondary | ICD-10-CM

## 2014-12-16 DIAGNOSIS — Z1212 Encounter for screening for malignant neoplasm of rectum: Secondary | ICD-10-CM

## 2014-12-16 DIAGNOSIS — K219 Gastro-esophageal reflux disease without esophagitis: Secondary | ICD-10-CM

## 2014-12-16 DIAGNOSIS — Z125 Encounter for screening for malignant neoplasm of prostate: Secondary | ICD-10-CM

## 2014-12-16 LAB — CBC WITH DIFFERENTIAL/PLATELET
BASOS PCT: 0 % (ref 0–1)
Basophils Absolute: 0 10*3/uL (ref 0.0–0.1)
Eosinophils Absolute: 0.2 10*3/uL (ref 0.0–0.7)
Eosinophils Relative: 2 % (ref 0–5)
HCT: 37.3 % — ABNORMAL LOW (ref 39.0–52.0)
Hemoglobin: 12.5 g/dL — ABNORMAL LOW (ref 13.0–17.0)
Lymphocytes Relative: 14 % (ref 12–46)
Lymphs Abs: 1.4 10*3/uL (ref 0.7–4.0)
MCH: 30.4 pg (ref 26.0–34.0)
MCHC: 33.5 g/dL (ref 30.0–36.0)
MCV: 90.8 fL (ref 78.0–100.0)
MPV: 10.6 fL (ref 8.6–12.4)
Monocytes Absolute: 1 10*3/uL (ref 0.1–1.0)
Monocytes Relative: 10 % (ref 3–12)
NEUTROS ABS: 7.5 10*3/uL (ref 1.7–7.7)
NEUTROS PCT: 74 % (ref 43–77)
Platelets: 232 10*3/uL (ref 150–400)
RBC: 4.11 MIL/uL — ABNORMAL LOW (ref 4.22–5.81)
RDW: 13.9 % (ref 11.5–15.5)
WBC: 10.2 10*3/uL (ref 4.0–10.5)

## 2014-12-16 LAB — HEPATIC FUNCTION PANEL
ALK PHOS: 81 U/L (ref 39–117)
ALT: 10 U/L (ref 0–53)
AST: 14 U/L (ref 0–37)
Albumin: 3.7 g/dL (ref 3.5–5.2)
Bilirubin, Direct: 0.1 mg/dL (ref 0.0–0.3)
Indirect Bilirubin: 0.4 mg/dL (ref 0.2–1.2)
Total Bilirubin: 0.5 mg/dL (ref 0.2–1.2)
Total Protein: 6.2 g/dL (ref 6.0–8.3)

## 2014-12-16 LAB — TSH: TSH: 1.251 u[IU]/mL (ref 0.350–4.500)

## 2014-12-16 LAB — LIPID PANEL
CHOLESTEROL: 199 mg/dL (ref 0–200)
HDL: 34 mg/dL — AB (ref >=40)
LDL Cholesterol: 146 mg/dL — ABNORMAL HIGH (ref 0–99)
TRIGLYCERIDES: 97 mg/dL (ref <150)
Total CHOL/HDL Ratio: 5.9 Ratio
VLDL: 19 mg/dL (ref 0–40)

## 2014-12-16 LAB — BASIC METABOLIC PANEL WITH GFR
BUN: 13 mg/dL (ref 6–23)
CHLORIDE: 101 meq/L (ref 96–112)
CO2: 31 mEq/L (ref 19–32)
CREATININE: 0.88 mg/dL (ref 0.50–1.35)
Calcium: 8.8 mg/dL (ref 8.4–10.5)
GFR, Est African American: >89 mL/min
GFR, Est Non African American: 85 mL/min
GLUCOSE: 108 mg/dL — AB (ref 70–99)
Potassium: 4 mEq/L (ref 3.5–5.3)
Sodium: 142 mEq/L (ref 135–145)

## 2014-12-16 LAB — HEMOGLOBIN A1C
Hgb A1c MFr Bld: 6 % — ABNORMAL HIGH (ref <5.7)
Mean Plasma Glucose: 126 mg/dL — ABNORMAL HIGH (ref <117)

## 2014-12-16 LAB — MAGNESIUM: MAGNESIUM: 1.8 mg/dL (ref 1.5–2.5)

## 2014-12-16 MED ORDER — VITAMIN D3 125 MCG (5000 UT) PO CAPS: ORAL_CAPSULE | ORAL | Status: AC

## 2014-12-16 NOTE — Patient Instructions (Signed)

## 2014-12-16 NOTE — Progress Notes (Signed)
Patient ID: Walter Snyder, male   DOB: [birth year], 74 y.o.   MRN: [id number]  Annual Comprehensive Examination  This very nice 74 y.o. MWM presents for complete physical.  Patient has been followed for HTN, Prediabetes, Hyperlipidemia, Testosterone and Vitamin D Deficiency. Patient retired disabled ([employer])  in 1990 due to DDD & chronic LBP.    HTN predates since 33. Patient's BP has been controlled at home.Today's BP: 134/82 mmHg. Patient does have an AAA followed annually by Dr Donnetta Hutching. Path had a negative heart cath in 2004. Patient denies any cardiac symptoms as chest pain, palpitations,  dizziness or ankle swelling. He does report exertional shortness of breath related to prior 40+ pk yrs of smoking (Q 1994) and deconditioning. Denies cough or chest congestion.   Patient's hyperlipidemia is not controlled with diet and he is Statin Intolerant. He stopped his zetia for c/o aching in his hands. Patient denies myalgias or other medication SE's. Last lipids were not at goal - Cholesterol 232; HDL 38; LDL 137; and elevated Triglycerides 283 on 07/22/2014.   Patient has prediabetes since 2010 with A1c 5.9% and 6.1% in 2013.  Patient denies reactive hypoglycemic symptoms, visual blurring, diabetic polys or paresthesias. Last A1c was  5.8% on  07/22/2014.       Patient has Testosterone Deficiency with low level of 212 in 2011 and 207 in 2012. His last shot was ~2 weeks ago.  Finally, patient has history of Vitamin D Deficiency of 34 in 2010 and last vitamin D was still low at 37 on 07/22/2014.    Medication Sig  . ALPRAZolam (XANAX) 1 MG tablet Take 1 tablet (1 mg total) by mouth 3 (three) times daily as needed.  . citalopram (CELEXA) 40 MG tablet Take 1 tablet (40 mg total) by mouth daily. FOR Mood  . doxazosin (CARDURA) 8 MG tablet Take 1 tablet (8 mg total) by mouth daily. For prostate and blood pressure  . fluticasone (FLONASE) 50 MCG/ACT nasal spray Place 1 spray into both nostrils daily.  .  Fluticasone-Salmeterol (ADVAIR DISKUS IN) Inhale into the lungs once. One inhalation 9 am  . lisinopril-hydrochlorothiazide (PRINZIDE,ZESTORETIC) 20-25 MG per tablet Take 1 tablet by mouth daily.  Marland Kitchen loratadine-pseudoephedrine (CLARITIN-D 12-HOUR) 5-120 MG per tablet Take 1 tablet by mouth 2 (two) times daily.  . Multiple Vitamin (MULTIVITAMIN) capsule Take 1 capsule by mouth daily.    Marland Kitchen oxybutynin (DITROPAN) 5 MG tablet Take 1 tablet 3 x day as needed for Overactive Bladder  . DEPO-TESTOSTERONE 200 MG/ML injection Inject 2 cc intra muscular every 2 weeks  . tiotropium (SPIRIVA) 18 MCG inhalation capsule Place 18 mcg into inhaler and inhale daily.    . cholecalciferol (VITAMIN D) 1000 UNITS tablet Take 1,000 Units by mouth daily.  Marland Kitchen ezetimibe (ZETIA) 10 MG tablet Take 10 mg by mouth daily.  Marland Kitchen HYDROcodone-acetaminophen \\VICODIN ) 5-325 MG per tablet Take 1 tablet by mouth every 6 (six) hours as needed for moderate pain.    No Known Allergies   Past Medical History  Diagnosis Date  . Bronchitis   . Depression with anxiety   . AAA (abdominal aortic aneurysm)   . Asthma   . Prediabetes   . Hyperlipidemia   . Hypertension   . COPD (chronic obstructive pulmonary disease)   . GERD (gastroesophageal reflux disease)   . Hiatal hernia   . Arthritis   . DDD (degenerative disc disease)   . Depression   . BPH (benign prostatic hyperplasia)   . Aneurysm   .  Cancer Dec. 12, 2014    [facility]- [facility]   Health Maintenance  Topic Date Due  . Samul Dada  12/22/1958  . ZOSTAVAX  12/22/1999  . PNA vac Low Risk Adult (2 of 2 - PCV13) 01/21/2008  . COLONOSCOPY  06/12/2011  . INFLUENZA VACCINE  01/10/2015   Immunization History  Administered Date(s) Administered  . DT 01/05/2004  . Influenza Split 02/24/2013  . Influenza-Unspecified 03/11/2014  . Pneumococcal Polysaccharide-23 01/21/2007   Past Surgical History  Procedure Laterality Date  . Knee arthroscopy      left  . Cyst excision      back   . Lens implant      bilateral  . Bcc  Dec. 12, 2014    [facility]- Back   ( Basal cell carcinoma )   Family History  Problem Relation Age of Onset  . Other Sister     AAA  . Heart disease Mother   . Diabetes Mother   . Heart attack Mother   . Heart disease Father   . Cancer Brother   . Diabetes Brother   . Heart disease Brother     before age 68  . Varicose Veins Brother   . Heart attack Brother   . Heart disease Son     before age 30   History   Social History  . Marital Status: Married    Spouse Name: N/A  . Number of Children: N/A  . Years of Education: N/A   Occupational History  . Retired disabled 1990 from [employer] from DDD.   Social History Main Topics  . Smoking status: Former Smoker    Types: Cigarettes    Quit date: 06/11/1992  . Smokeless tobacco: Never Used  . Alcohol Use: No  . Drug Use: No  . Sexual Activity: No    ROS Constitutional: Denies fever, chills, weight loss/gain, headaches, insomnia,  night sweats or change in appetite. Does c/o fatigue. Eyes: Denies redness, blurred vision, diplopia, discharge, itchy or watery eyes.  ENT: Denies discharge, congestion, post nasal drip, epistaxis, sore throat, earache, hearing loss, dental pain, Tinnitus, Vertigo, Sinus pain or snoring.  Cardio: Denies chest pain, palpitations, irregular heartbeat, syncope, dyspnea, diaphoresis, orthopnea, PND, claudication or edema Respiratory: denies cough, dyspnea, DOE, pleurisy, hoarseness, laryngitis or wheezing.  Gastrointestinal: Denies dysphagia, heartburn, reflux, water brash, pain, cramps, nausea, vomiting, bloating, diarrhea, constipation, hematemesis, melena, hematochezia, jaundice or hemorrhoids Genitourinary: Denies dysuria, frequency, urgency, nocturia, hesitancy, discharge, hematuria or flank pain Musculoskeletal: has chronic LBP and otherwise denies arthralgia, myalgia, stiffness, Jt. Swelling, pain, limp or strain/sprain. Has had 3 falls over the last year  related to tripping x 2 and once slipping on ice/snow. Skin: Denies puritis, rash, hives, warts, acne, eczema or change in skin lesion Neuro: No weakness, tremor, incoordination, spasms, paresthesia or pain Psychiatric: Denies confusion, memory loss or sensory loss. Denies Depression. Endocrine: Denies change in weight, skin, hair change, nocturia, and paresthesia, diabetic polys, visual blurring or hyper / hypo glycemic episodes.  Heme/Lymph: No excessive bleeding, bruising or enlarged lymph nodes.  Physical Exam  BP 134/82   Pulse 96  Temp 97.7 F   Resp 16  Ht 6'  Wt 228 lb 3.2 oz     BMI 30.94   General Appearance: Well nourished, in no apparent distress. Eyes: PERRLA, EOMs, conjunctiva no swelling or erythema, normal fundi and vessels. Sinuses: No frontal/maxillary tenderness ENT/Mouth: EACs patent / TMs  nl. Nares clear without erythema, swelling, mucoid exudates. Oral hygiene is  good. No erythema, swelling, or exudate. Tongue normal, non-obstructing. Tonsils not swollen or erythematous. Hearing normal.  Neck: Supple, thyroid normal. No bruits, nodes or JVD. Respiratory: Respiratory effort normal.  BS equal and clear bilateral without rales, rhonci, wheezing or stridor. Cardio: Heart sounds are normal with regular rate and rhythm and no murmurs, rubs or gallops. Peripheral pulses are normal and equal bilaterally without edema. No aortic or femoral bruits. Chest: symmetric with normal excursions and percussion.  Abdomen: rotund, soft, with bowel sounds. Nontender, no guarding, rebound, hernias, masses, or organomegaly.  Lymphatics: Non tender without lymphadenopathy.  Genitourinary: No hernias.Testes nl. DRE - prostate nl for age - smooth & firm w/o nodules. Musculoskeletal: Full ROM all peripheral extremities, joint stability, 5/5 strength, and normal gait. Skin: Warm and dry without rashes, lesions, cyanosis, clubbing or  ecchymosis.  Neuro: Cranial nerves intact, reflexes equal  bilaterally. Normal muscle tone, no cerebellar symptoms. Sensation intact.  Pysch: Awake and oriented X 3 with normal affect, insight and judgment appropriate.   Assessment and Plan  1. Essential hypertension  - Microalbumin / creatinine urine ratio - EKG 12-Lead - TSH  2. Hyperlipidemia  - Lipid panel  3. Prediabetes  - Hemoglobin A1c - Insulin, random  4. Vitamin D deficiency  - Vit D  25 hydroxy   5. Gastroesophageal reflux disease   6. Chronic obstructive pulmonary disease   7. Testosterone deficiency  - Testosterone  8. BPH    9. Depression, controlled   10. Depression screen   11. Screening for rectal cancer  - POC Hemoccult Bld/Stl (3-Cd Home Screen); Future  12. Prostate cancer screening   13. At moderate risk for fall   14. Medication management  - Urine Microscopic - CBC with Differential/Platelet - BASIC METABOLIC PANEL WITH GFR - Hepatic function panel - Magnesium   Continue prudent diet as discussed, weight control, BP monitoring, regular exercise, and medications as discussed.  Discussed med effects and SE's. Routine screening labs and tests as requested with regular follow-up as recommended.  Over 40 minutes of exam, counseling &  chart review was performed

## 2014-12-17 ENCOUNTER — Other Ambulatory Visit: Payer: Self-pay | Admitting: *Deleted

## 2014-12-17 LAB — MICROALBUMIN / CREATININE URINE RATIO
Creatinine, Urine: 146.1 mg/dL
MICROALB/CREAT RATIO: 32.2 mg/g — AB (ref 0.0–30.0)
Microalb, Ur: 4.7 mg/dL — ABNORMAL HIGH (ref <2.0)

## 2014-12-17 LAB — URINALYSIS, MICROSCOPIC ONLY
BACTERIA UA: NONE SEEN
CRYSTALS: NONE SEEN
Casts: NONE SEEN
SQUAMOUS EPITHELIAL / LPF: NONE SEEN

## 2014-12-17 LAB — INSULIN, RANDOM: Insulin: 7 u[IU]/mL (ref 2.0–19.6)

## 2014-12-17 LAB — TESTOSTERONE: TESTOSTERONE: 425 ng/dL (ref 300–890)

## 2014-12-17 LAB — VITAMIN D 25 HYDROXY (VIT D DEFICIENCY, FRACTURES): Vit D, 25-Hydroxy: 38 ng/mL (ref 30–100)

## 2014-12-17 MED ORDER — OXYBUTYNIN CHLORIDE 5 MG PO TABS: ORAL_TABLET | ORAL | Status: AC

## 2014-12-17 MED ORDER — TAMSULOSIN HCL 0.4 MG PO CAPS: 0.4000 mg | ORAL_CAPSULE | Freq: Every morning | ORAL | Status: DC

## 2014-12-17 MED ORDER — FLUTICASONE PROPIONATE 50 MCG/ACT NA SUSP: 1.0000 | Freq: Every day | NASAL | Status: DC

## 2014-12-17 MED ORDER — CITALOPRAM HYDROBROMIDE 40 MG PO TABS: 40.0000 mg | ORAL_TABLET | Freq: Every day | ORAL | Status: DC

## 2014-12-20 ENCOUNTER — Other Ambulatory Visit: Payer: Self-pay | Admitting: *Deleted

## 2015-01-20 ENCOUNTER — Other Ambulatory Visit: Payer: Self-pay | Admitting: Physician Assistant

## 2015-01-20 DIAGNOSIS — G4700 Insomnia, unspecified: Secondary | ICD-10-CM

## 2015-01-20 MED ORDER — ALPRAZOLAM 1 MG PO TABS: 1.0000 mg | ORAL_TABLET | Freq: Three times a day (TID) | ORAL | Status: DC | PRN

## 2015-01-20 NOTE — Progress Notes (Signed)
Rx called into pharm 

## 2015-02-15 ENCOUNTER — Ambulatory Visit: Payer: MEDICARE | Admitting: Vascular Surgery

## 2015-02-15 ENCOUNTER — Other Ambulatory Visit (HOSPITAL_COMMUNITY): Payer: MEDICARE

## 2015-03-07 ENCOUNTER — Encounter: Payer: Self-pay | Admitting: Vascular Surgery

## 2015-03-08 ENCOUNTER — Ambulatory Visit (HOSPITAL_COMMUNITY)
Admission: RE | Admit: 2015-03-08 | Discharge: 2015-03-08 | Disposition: A | Discharge status: Home / Self Care | Payer: MEDICARE | Source: Ambulatory Visit | Attending: Vascular Surgery | Admitting: Vascular Surgery

## 2015-03-08 ENCOUNTER — Encounter: Payer: Self-pay | Admitting: Vascular Surgery

## 2015-03-08 ENCOUNTER — Ambulatory Visit (INDEPENDENT_AMBULATORY_CARE_PROVIDER_SITE_OTHER): Payer: MEDICARE | Admitting: Vascular Surgery

## 2015-03-08 VITALS — BP 121/70 | HR 72 | Temp 97.0°F | Resp 18 | Ht 72.0 in | Wt 222.4 lb

## 2015-03-08 DIAGNOSIS — I714 Abdominal aortic aneurysm, without rupture, unspecified: Secondary | ICD-10-CM

## 2015-03-08 NOTE — Addendum Note (Signed)
Addended by: Dorthula Rue L on: 03/08/2015 01:41 PM   Modules accepted: Orders

## 2015-03-08 NOTE — Progress Notes (Signed)
Here today for continued follow-up of his abdominal aortic aneurysm. He reports that he has been evaluated with congestive heart failure since my last visit with him. As had some improvement in his breathing with direct treatment. Reports that he had more swelling in his legs which is resolved as well. He has no symptoms regarding his aneurysm.  Past Medical History  Diagnosis Date  . Bronchitis   . Depression with anxiety   . AAA (abdominal aortic aneurysm)   . Asthma   . Prediabetes   . Hyperlipidemia   . Hypertension   . COPD (chronic obstructive pulmonary disease)   . GERD (gastroesophageal reflux disease)   . Hiatal hernia   . Arthritis   . DDD (degenerative disc disease)   . Depression   . BPH (benign prostatic hyperplasia)   . Aneurysm   . Cancer Dec. 12, 2014    [facility]- Back  . CHF (congestive heart failure)   . Edema     bilateral lower extremities    Social History  Substance Use Topics  . Smoking status: Former Smoker    Types: Cigarettes    Quit date: 06/11/1992  . Smokeless tobacco: Never Used  . Alcohol Use: No    Family History  Problem Relation Age of Onset  . Other Sister     AAA  . Heart disease Mother   . Diabetes Mother   . Heart attack Mother   . Heart disease Father   . Cancer Brother   . Diabetes Brother   . Heart disease Brother     before age 43  . Varicose Veins Brother   . Heart attack Brother   . Heart disease Son     before age 78    No Known Allergies   Current outpatient prescriptions:  .  ALPRAZolam (XANAX) 1 MG tablet, Take 1 tablet (1 mg total) by mouth 3 (three) times daily as needed., Disp: 90 tablet, Rfl: 1 .  Cholecalciferol (VITAMIN D3) 5000 UNITS CAPS, Take 2 caps (10,000) units daily, Disp: , Rfl:  .  citalopram (CELEXA) 40 MG tablet, Take 1 tablet (40 mg total) by mouth daily. FOR Mood, Disp: 90 tablet, Rfl: 3 .  doxazosin (CARDURA) 8 MG tablet, Take 1 tablet (8 mg total) by mouth daily. For prostate and blood  pressure, Disp: 90 tablet, Rfl: 3 .  fluticasone (FLONASE) 50 MCG/ACT nasal spray, Place 1 spray into both nostrils daily., Disp: 48 g, Rfl: 3 .  furosemide (LASIX) 20 MG tablet, Take 20 mg by mouth daily., Disp: , Rfl:  .  loratadine-pseudoephedrine (CLARITIN-D 12-HOUR) 5-120 MG per tablet, Take 1 tablet by mouth 2 (two) times daily., Disp: , Rfl:  .  Multiple Vitamin (MULTIVITAMIN) capsule, Take 1 capsule by mouth daily.  , Disp: , Rfl:  .  spironolactone (ALDACTONE) 25 MG tablet, Take 25 mg by mouth daily. Takes 1/2  (25 mg tablet) tablet daily., Disp: , Rfl:  .  tamsulosin (FLOMAX) 0.4 MG CAPS capsule, Take 1 capsule (0.4 mg total) by mouth every morning. For prostate, Disp: 90 capsule, Rfl: 3 .  testosterone cypionate (DEPO-TESTOSTERONE) 200 MG/ML injection, Inject 2 cc intra muscular every 2 weeks, Disp: 10 mL, Rfl: 3 .  tiotropium (SPIRIVA) 18 MCG inhalation capsule, Place 18 mcg into inhaler and inhale daily.  , Disp: , Rfl:  .  ezetimibe (ZETIA) 10 MG tablet, Take 10 mg by mouth daily., Disp: , Rfl:  .  Fluticasone-Salmeterol (ADVAIR DISKUS IN), Inhale into the  lungs once. One inhalation 9 am, Disp: , Rfl:  .  oxybutynin (DITROPAN) 5 MG tablet, Take 1 tablet 3 x day as needed for Overactive Bladder (Patient not taking: Reported on 03/08/2015), Disp: 270 tablet, Rfl: 3 .  [DISCONTINUED] fluticasone (VERAMYST) 27.5 MCG/SPRAY nasal spray, Place 2 sprays into the nose daily., Disp: , Rfl:   Filed Vitals:   03/08/15 0947  BP: 121/70  Pulse: 72  Temp: 97 F (36.1 C)  TempSrc: Oral  Resp: 18  Height: 6' (1.829 m)  Weight: 222 lb 6.4 oz (100.88 kg)  SpO2: 93%    Body mass index is 30.16 kg/(m^2).        Physical exam his abdomen is soft and nontender. Does have prominent aortic ulceration. Has palpable femoral pulses bilaterally.   Ultrasound today reveals no significant change. Maximal diameter of his infrarenal aorta is 5.6 cm. Ultrasound was 5.4 cm and prior CT scan was 5.4 cm  as well.   Impression and plan a stable aneurysm with no significant change in size. 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 His non- aneurysmal aorta at the level of the diaphragm in the renal arteries is 4 cm. Explain that the 5.6 cm aneurysmal dilatation should be less risky with his generalized arteriomegaly then was of normal-size aorta. I do not feel that he has any benefit for treatment versus observation at this time. This is particularly in light of his exacerbation of congestive heart failure. We will see him again in 6 months with repeat ultrasound. Again reviewed symptoms of leaking aneurysm he knows report immediately to the emergency room should this occur

## 2015-03-25 ENCOUNTER — Ambulatory Visit: Payer: Self-pay | Admitting: Internal Medicine

## 2015-04-04 ENCOUNTER — Other Ambulatory Visit: Payer: Self-pay | Admitting: *Deleted

## 2015-04-04 DIAGNOSIS — G4700 Insomnia, unspecified: Secondary | ICD-10-CM

## 2015-04-04 MED ORDER — ALPRAZOLAM 1 MG PO TABS: 1.0000 mg | ORAL_TABLET | Freq: Three times a day (TID) | ORAL | Status: DC | PRN

## 2015-06-17 ENCOUNTER — Other Ambulatory Visit: Payer: Self-pay | Admitting: *Deleted

## 2015-06-17 MED ORDER — TESTOSTERONE CYPIONATE 200 MG/ML IM SOLN: INTRAMUSCULAR | Status: DC

## 2015-07-01 ENCOUNTER — Ambulatory Visit: Payer: Self-pay | Admitting: Internal Medicine

## 2015-08-15 ENCOUNTER — Other Ambulatory Visit: Payer: Self-pay | Admitting: *Deleted

## 2015-08-15 DIAGNOSIS — G4700 Insomnia, unspecified: Secondary | ICD-10-CM

## 2015-08-15 MED ORDER — ALPRAZOLAM 1 MG PO TABS: 1.0000 mg | ORAL_TABLET | Freq: Three times a day (TID) | ORAL | Status: DC | PRN

## 2015-09-02 ENCOUNTER — Encounter: Payer: Self-pay | Admitting: Family

## 2015-09-05 ENCOUNTER — Other Ambulatory Visit: Payer: Self-pay | Admitting: Physician Assistant

## 2015-09-13 ENCOUNTER — Encounter: Payer: Self-pay | Admitting: Family

## 2015-09-13 ENCOUNTER — Ambulatory Visit (HOSPITAL_COMMUNITY)
Admission: RE | Admit: 2015-09-13 | Discharge: 2015-09-13 | Disposition: A | Discharge status: Home / Self Care | Payer: MEDICARE | Source: Ambulatory Visit | Attending: Family | Admitting: Family

## 2015-09-13 ENCOUNTER — Ambulatory Visit (INDEPENDENT_AMBULATORY_CARE_PROVIDER_SITE_OTHER): Payer: MEDICARE | Admitting: Family

## 2015-09-13 VITALS — BP 120/78 | HR 74 | Temp 98.2°F | Resp 16 | Ht 72.0 in | Wt 215.0 lb

## 2015-09-13 DIAGNOSIS — K219 Gastro-esophageal reflux disease without esophagitis: Secondary | ICD-10-CM | POA: Diagnosis not present

## 2015-09-13 DIAGNOSIS — I509 Heart failure, unspecified: Secondary | ICD-10-CM | POA: Diagnosis not present

## 2015-09-13 DIAGNOSIS — I714 Abdominal aortic aneurysm, without rupture, unspecified: Secondary | ICD-10-CM

## 2015-09-13 DIAGNOSIS — I110 Hypertensive heart disease with heart failure: Secondary | ICD-10-CM | POA: Diagnosis not present

## 2015-09-13 DIAGNOSIS — R7303 Prediabetes: Secondary | ICD-10-CM | POA: Diagnosis not present

## 2015-09-13 DIAGNOSIS — E785 Hyperlipidemia, unspecified: Secondary | ICD-10-CM | POA: Diagnosis not present

## 2015-09-13 NOTE — Progress Notes (Signed)
[facility]   Established Abdominal Aortic Aneurysm  History of Present Illness  Walter Snyder is a 75 y.o. (06/21/1940) male patient of Dr. Donnetta Hutching who returns today for continued follow-up of his abdominal aortic aneurysm. He reports that he has been diagnosed with congestive heart failure. He had some improvement in his breathing with treatment. Reports that he had more swelling in his legs which is resolved as well. He has no symptoms regarding his aneurysm. Dr. Donnetta Hutching last evaluated pt on 03/08/15. At that time Dr. Donnetta Hutching explained again that pt does have diffuse arteriomegaly. His non- aneurysmal aorta at the level of the diaphragm in the renal arteries is 4 cm. Explained that the 5.6 cm aneurysmal dilatation should be less risky with his generalized arteriomegaly then was of normal-size aorta. Dr. Donnetta Hutching did not feel that pt has any benefit for treatment versus observation at this time. This is particularly in light of his exacerbation of congestive heart failure.   Pt returns today for 6 months follow up of his AAA.  The patient denies claudication in legs with walking. The patient denies history of stroke or TIA symptoms.  Pt states that he had a CT angiogram recently and Dr. Donnetta Hutching told him he is not a candidate for an EVAR.  Pt reports he had 3 cardiac stents placed in December 2016; states he was placed on Plavix for a year and also 81 mg ASA.  Pt Diabetic: No Pt smoker: former smoker, quit in 1984   Past Medical History  Diagnosis Date  . Bronchitis   . Depression with anxiety   . AAA (abdominal aortic aneurysm) ([place])   . Asthma   . Prediabetes   . Hyperlipidemia   . Hypertension   . COPD (chronic obstructive pulmonary disease) ([place])   . GERD (gastroesophageal reflux disease)   . Hiatal hernia   . Arthritis   . DDD (degenerative disc disease)   . Depression   . BPH (benign prostatic hyperplasia)   . Aneurysm ([place])   . Cancer ([place]) Dec. 12, 2014     [facility]  . CHF (congestive heart failure) ([place])   . Edema     bilateral lower extremities   Past Surgical History  Procedure Laterality Date  . Knee arthroscopy      left  . Cyst excision      back  . Lens implant      bilateral  . Bcc  Dec. 12, 2014    [facility]   ( Basal cell carcinoma )   Social History Social History   Social History  . Marital Status: Married    Spouse Name: N/A  . Number of Children: N/A  . Years of Education: N/A   Occupational History  . Not on file.   Social History Main Topics  . Smoking status: Former Smoker    Types: Cigarettes    Quit date: 06/11/1992  . Smokeless tobacco: Never Used  . Alcohol Use: No  . Drug Use: No  . Sexual Activity: Not on file   Other Topics Concern  . Not on file   Social History Narrative   Family History Family History  Problem Relation Age of Onset  . Other Sister     AAA  . Heart disease Mother   . Diabetes Mother   . Heart attack Mother   . Heart disease Father   . Cancer Brother   . Diabetes Brother   . Heart disease Brother  before age 84  . Varicose Veins Brother   . Heart attack Brother   . Heart disease Son     before age 47    Current Outpatient Prescriptions on File Prior to Visit  Medication Sig Dispense Refill  . ALPRAZolam (XANAX) 1 MG tablet Take 1 tablet (1 mg total) by mouth 3 (three) times daily as needed. 90 tablet 1  . Cholecalciferol (VITAMIN D3) 5000 UNITS CAPS Take 2 caps (10,000) units daily    . citalopram (CELEXA) 40 MG tablet Take 1 tablet (40 mg total) by mouth daily. FOR Mood 90 tablet 3  . doxazosin (CARDURA) 8 MG tablet Take 1 tablet by mouth  daily for prostate and for  blood pressure 90 tablet 0  . ezetimibe (ZETIA) 10 MG tablet Take 10 mg by mouth daily.    . fluticasone (FLONASE) 50 MCG/ACT nasal spray Place 1 spray into both nostrils daily. 48 g 3  . Fluticasone-Salmeterol (ADVAIR DISKUS IN) Inhale into the lungs once. One inhalation 9 am    .  furosemide (LASIX) 20 MG tablet Take 20 mg by mouth daily.    Marland Kitchen loratadine-pseudoephedrine (CLARITIN-D 12-HOUR) 5-120 MG per tablet Take 1 tablet by mouth 2 (two) times daily.    . Multiple Vitamin (MULTIVITAMIN) capsule Take 1 capsule by mouth daily.      Marland Kitchen oxybutynin (DITROPAN) 5 MG tablet Take 1 tablet 3 x day as needed for Overactive Bladder 270 tablet 3  . spironolactone (ALDACTONE) 25 MG tablet Take 25 mg by mouth daily. Takes 1/2  (25 mg tablet) tablet daily.    . tamsulosin (FLOMAX) 0.4 MG CAPS capsule Take 1 capsule (0.4 mg total) by mouth every morning. For prostate 90 capsule 3  . testosterone cypionate (DEPO-TESTOSTERONE) 200 MG/ML injection Inject 2 cc intra muscular every 2 weeks 10 mL 0  . tiotropium (SPIRIVA) 18 MCG inhalation capsule Place 18 mcg into inhaler and inhale daily.      . [DISCONTINUED] fluticasone (VERAMYST) 27.5 MCG/SPRAY nasal spray Place 2 sprays into the nose daily.     No current facility-administered medications on file prior to visit.   No Known Allergies  ROS: See HPI for pertinent positives and negatives.  Physical Examination  Filed Vitals:   09/13/15 1153  BP: 120/78  Pulse: 74  Temp: 98.2 F (36.8 C)  TempSrc: Oral  Resp: 16  Height: 6' (1.829 m)  Weight: 215 lb (97.523 kg)  SpO2: 94%   Body mass index is 29.15 kg/(m^2).  General: A&O x 3, WD, overweight, central adiposity.  Pulmonary: Sym exp, non labored respirations, adequate air movt throughout, CTAB Cardiac: RRR, Nl S1, S2, no Murmurs, rubs or gallops appreciated.  Carotid Bruits Left Right   Negative Negative  Aorta is not palpable Radial pulses are 2+ and equal.   VASCULAR EXAM:     LE Pulses LEFT RIGHT   FEMORAL palpable Unable to palpate due to tenderness since cardiac cath entrance site December  2016    POPLITEAL not palpable  not palpable   POSTERIOR TIBIAL not palpable  not  palpable    DORSALIS PEDIS  ANTERIOR TIBIAL palpable  palpable     Gastrointestinal: soft, NTND, -G/R, - HSM, asymptomatic reducible ventral hernia,  - CVAT B.  Musculoskeletal: M/S 5/5 throughout, Extremities without ischemic changes.   Neurologic: CN 2-12 intact, Pain and light touch intact in extremities, Motor exam as listed above.    CT angiogram 02/04/12: Cardiomegaly and 1. Interval increase in bilobed fusiform  aneurysmal dilatation of  the entirety of the abdominal aorta. The juxtarenal component of  the abdominal aortic aneurysm now measures approximately 4.3 x 4.8  cm (previously, 3.9 x 4.6 cm), while the infrarenal component of  the abdominal aortic aneurysm now measures approximately 5.0 x 4.4  cm (previously, 4.9 x 4.2 cm).  2. The amount of irregular mural thrombus within the abdominal  aorta has likely increased in the interval, however does not result  in hemodynamically significant narrowing. No evidence of abdominal  aortic dissection or periaortic stranding.          Non-Invasive Vascular Imaging  AAA Duplex (09/13/2015) Technically difficult study due to excessive bowel gas and body habitus. Abdominal aorta arteriomegally with a maximum diameter of 5.35 cm. No significant change compared to previous exam of outside facility.   Medical Decision Making  The patient is a 75 y.o. male who has known diffuse abdominal aorta arterio megaly. He has no sx's referable to his abdominal aorta. Today's AAA duplex suggests a technically difficult study due to excessive bowel gas and body habitus. Abdominal aorta arteriomegally with a maximum diameter of 5.35 cm. No significant change compared to previous exam of outside facility.     Based on this patient's exam and diagnostic studies, the patient will follow up in 6 months  with the  following studies: AAA duplex.  Consideration for repair of AAA would be made when the size is 5.5 cm, growth > 1 cm/yr, and symptomatic status.  I emphasized the importance of maximal medical management including strict control of blood pressure, blood glucose, and lipid levels, antiplatelet agents, obtaining regular exercise, and continued cessation of smoking.   The patient was given information about AAA including signs, symptoms, treatment, and how to minimize the risk of enlargement and rupture of aneurysms.    The patient was advised to call 911 should the patient experience sudden onset abdominal or back pain.   Thank you for allowing [place] to participate in this patient's care.  Clemon Chambers, RN, MSN, FNP-C [facility] [place] Office: [facility] Physician: Early  09/13/2015, 11:55 AM

## 2015-09-13 NOTE — Patient Instructions (Signed)
Abdominal Aortic Aneurysm An aneurysm is a weakened or damaged part of an artery wall that bulges from the normal force of blood pumping through the body. An abdominal aortic aneurysm is an aneurysm that occurs in the lower part of the aorta, the main artery of the body.  The major concern with an abdominal aortic aneurysm is that it can enlarge and burst (rupture) or blood can flow between the layers of the wall of the aorta through a tear (aorticdissection). Both of these conditions can cause bleeding inside the body and can be life threatening unless diagnosed and treated promptly. CAUSES  The exact cause of an abdominal aortic aneurysm is unknown. Some contributing factors are:   A hardening of the arteries caused by the buildup of fat and other substances in the lining of a blood vessel (arteriosclerosis).  Inflammation of the walls of an artery (arteritis).   Connective tissue diseases, such as Marfan syndrome.   Abdominal trauma.   An infection, such as syphilis or staphylococcus, in the wall of the aorta (infectious aortitis) caused by bacteria. RISK FACTORS  Risk factors that contribute to an abdominal aortic aneurysm may include:  Age older than 60 years.   High blood pressure (hypertension).  Male gender.  Ethnicity (white race).  Obesity.  Family history of aneurysm (first degree relatives only).  Tobacco use. PREVENTION  The following healthy lifestyle habits may help decrease your risk of abdominal aortic aneurysm:  Quitting smoking. Smoking can raise your blood pressure and cause arteriosclerosis.  Limiting or avoiding alcohol.  Keeping your blood pressure, blood sugar level, and cholesterol levels within normal limits.  Decreasing your salt intake. In somepeople, too much salt can raise blood pressure and increase your risk of abdominal aortic aneurysm.  Eating a diet low in saturated fats and cholesterol.  Increasing your fiber intake by including  whole grains, vegetables, and fruits in your diet. Eating these foods may help lower blood pressure.  Maintaining a healthy weight.  Staying physically active and exercising regularly. SYMPTOMS  The symptoms of abdominal aortic aneurysm may vary depending on the size and rate of growth of the aneurysm.Most grow slowly and do not have any symptoms. When symptoms do occur, they may include:  Pain (abdomen, side, lower back, or groin). The pain may vary in intensity. A sudden onset of severe pain may indicate that the aneurysm has ruptured.  Feeling full after eating only small amounts of food.  Nausea or vomiting or both.  Feeling a pulsating lump in the abdomen.  Feeling faint or passing out. DIAGNOSIS  Since most unruptured abdominal aortic aneurysms have no symptoms, they are often discovered during diagnostic exams for other conditions. An aneurysm may be found during the following procedures:  Ultrasonography (A one-time screening for abdominal aortic aneurysm by ultrasonography is also recommended for all men aged 65-75 years who have ever smoked).  X-ray exams.  A computed tomography (CT).  Magnetic resonance imaging (MRI).  Angiography or arteriography. TREATMENT  Treatment of an abdominal aortic aneurysm depends on the size of your aneurysm, your age, and risk factors for rupture. Medication to control blood pressure and pain may be used to manage aneurysms smaller than 6 cm. Regular monitoring for enlargement may be recommended by your caregiver if:  The aneurysm is 3-4 cm in size (an annual ultrasonography may be recommended).  The aneurysm is 4-4.5 cm in size (an ultrasonography every 6 months may be recommended).  The aneurysm is larger than 4.5 cm in   size (your caregiver may ask that you be examined by a vascular surgeon). If your aneurysm is larger than 6 cm, surgical repair may be recommended. There are two main methods for repair of an aneurysm:   Endovascular  repair (a minimally invasive surgery). This is done most often.  Open repair. This method is used if an endovascular repair is not possible.   This information is not intended to replace advice given to you by your health care provider. Make sure you discuss any questions you have with your health care provider.   Document Released: 03/07/2005 Document Revised: 09/22/2012 Document Reviewed: 06/27/2012 Elsevier Interactive Patient Education 2016 [employer]  

## 2015-09-26 NOTE — Addendum Note (Signed)
Addended by: Thresa Ross C on: 09/26/2015 04:03 PM   Modules accepted: Orders

## 2015-11-01 ENCOUNTER — Other Ambulatory Visit: Payer: Self-pay

## 2015-11-01 DIAGNOSIS — E785 Hyperlipidemia, unspecified: Secondary | ICD-10-CM

## 2015-11-01 MED ORDER — ATORVASTATIN CALCIUM 80 MG PO TABS: ORAL_TABLET | ORAL | Status: DC

## 2015-11-29 DIAGNOSIS — I4891 Unspecified atrial fibrillation: Secondary | ICD-10-CM | POA: Insufficient documentation

## 2015-12-03 DIAGNOSIS — Z7901 Long term (current) use of anticoagulants: Secondary | ICD-10-CM | POA: Insufficient documentation

## 2015-12-19 ENCOUNTER — Other Ambulatory Visit: Payer: Self-pay | Admitting: Internal Medicine

## 2015-12-19 DIAGNOSIS — N320 Bladder-neck obstruction: Secondary | ICD-10-CM

## 2016-01-02 ENCOUNTER — Encounter: Payer: Self-pay | Admitting: Internal Medicine

## 2016-01-19 IMAGING — CT CT CTA ABD/PEL W/CM AND/OR W/O CM
3 of 7 series · 12 of 36 positions shown, 18 images · IV contrast ([ID] [id number])
Comparison: 05/27/2012

CLINICAL DATA: History of abdominal aortic aneurysm.

EXAM:
CT ANGIOGRAPHY ABDOMEN AND PELVIS WITH CONTRAST AND WITHOUT CONTRAST
TECHNIQUE: Multidetector CT imaging of the abdomen and pelvis was performed
using the standard protocol during bolus administration of
intravenous contrast. Multiplanar reconstructed images including
MIPs were obtained and reviewed to evaluate the vascular anatomy.
CONTRAST:  100mL OMNIPAQUE IOHEXOL 350 MG/ML SOLN

[Series 4: angio · axial · 0.89mm/px · z∈[-330,-15]mm · 6 of 178 slices shown, 11 images]
[im 26/178  soft-tissue]
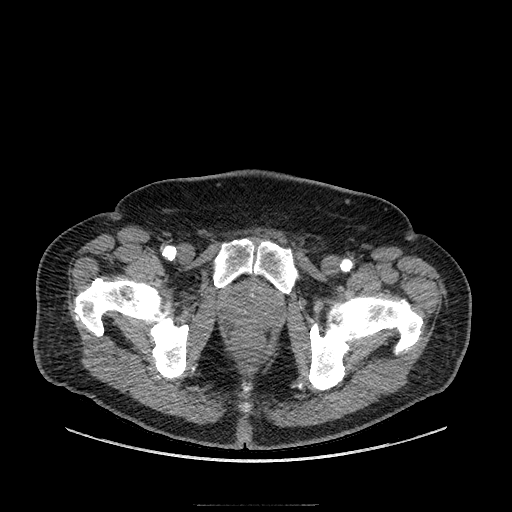
[im 26/178  bone]
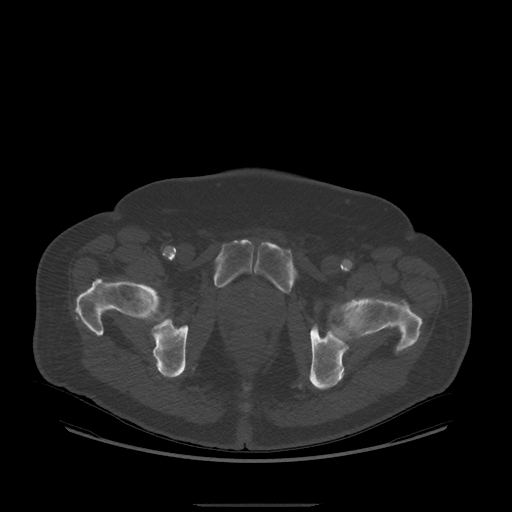
[im 51/178  soft-tissue]
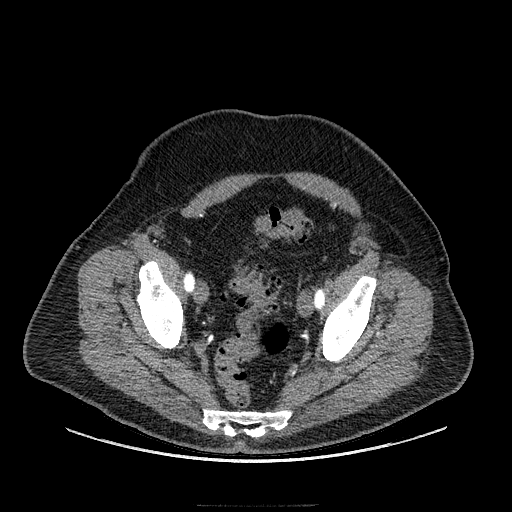
[im 76/178  soft-tissue]
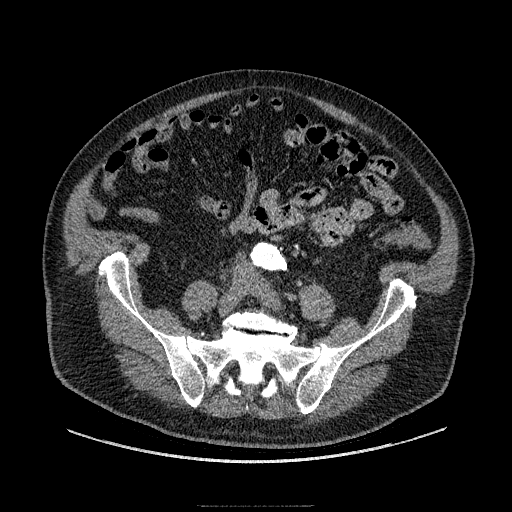
[im 76/178  lung]
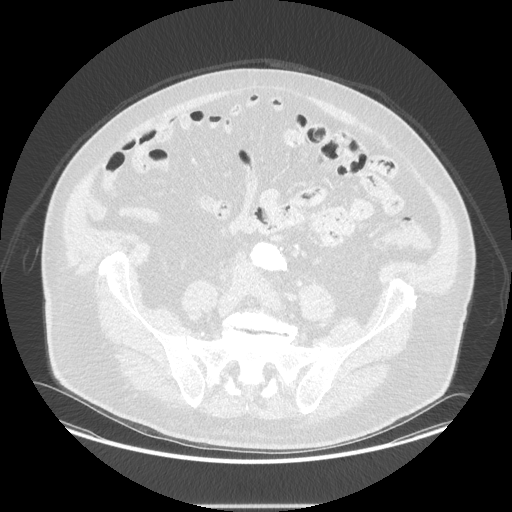
[im 102/178  soft-tissue]
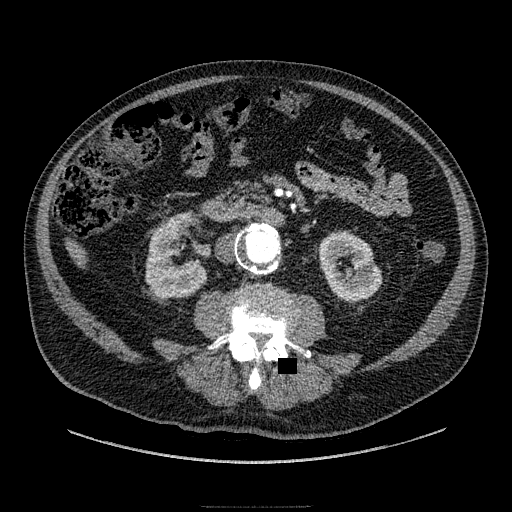
[im 102/178  lung]
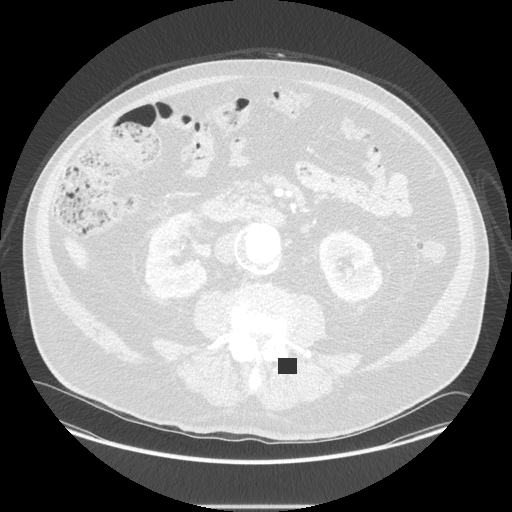
[im 127/178  soft-tissue]
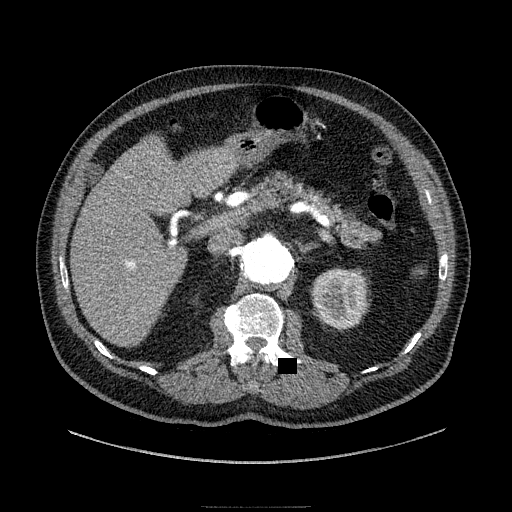
[im 127/178  lung]
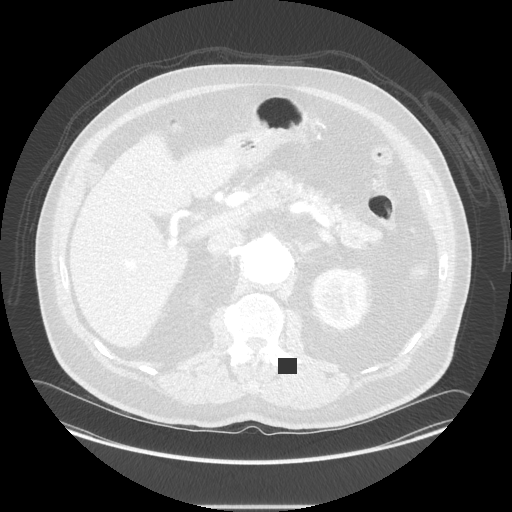
[im 152/178  soft-tissue]
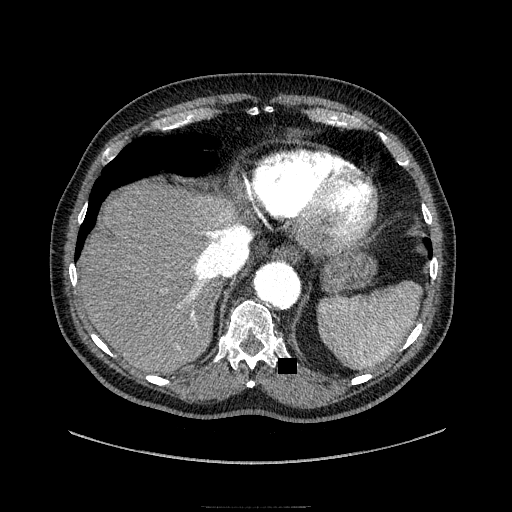
[im 152/178  lung]
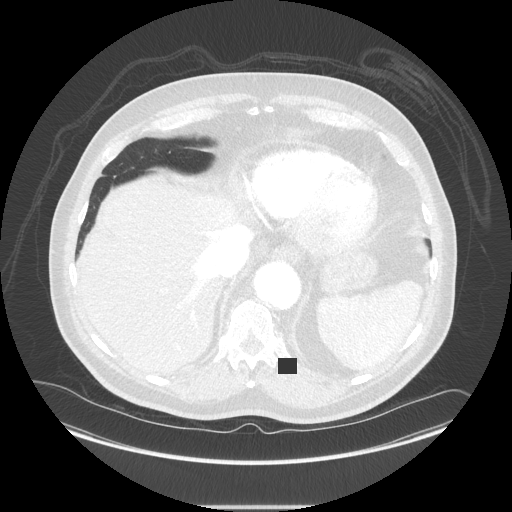

[Series 601: coronal body · coronal · 0.89mm/px · 1 of 146 slices shown, 2 images]
[im 49/146  soft-tissue]
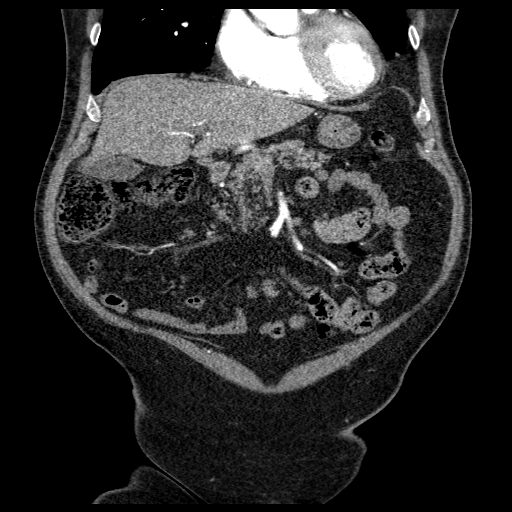
[im 49/146  bone]
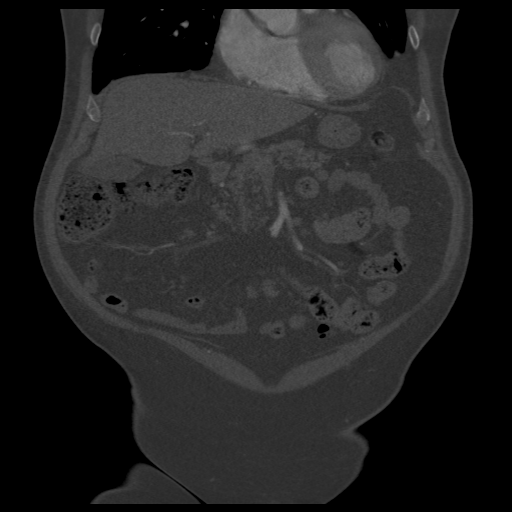

[Series 602: sagittal body · sagittal · 0.89mm/px · 5 of 169 slices shown]
[im 29/169  soft-tissue]
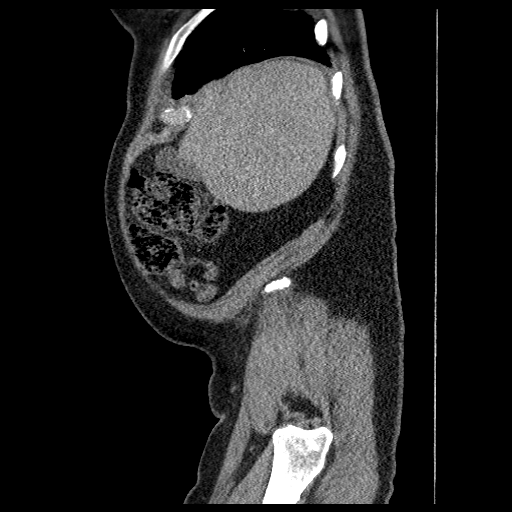
[im 57/169  soft-tissue]
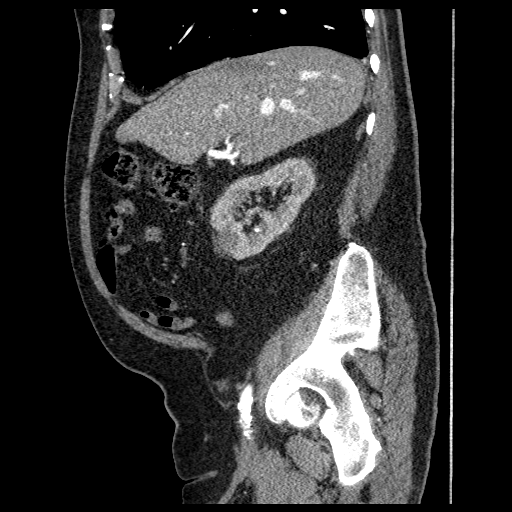
[im 85/169  soft-tissue]
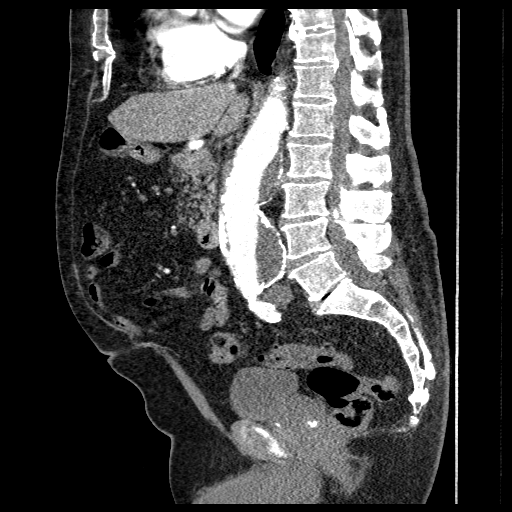
[im 113/169  soft-tissue]
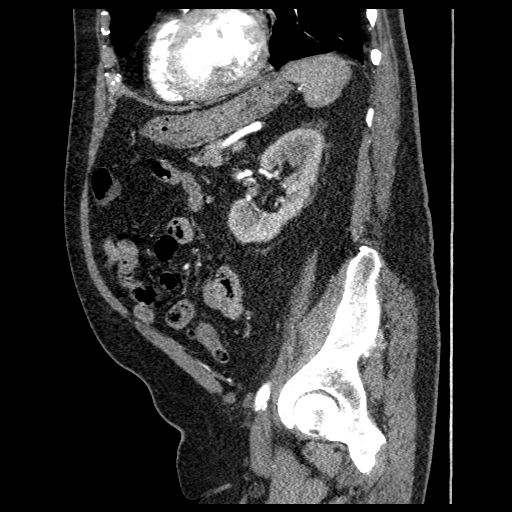
[im 141/169  soft-tissue]
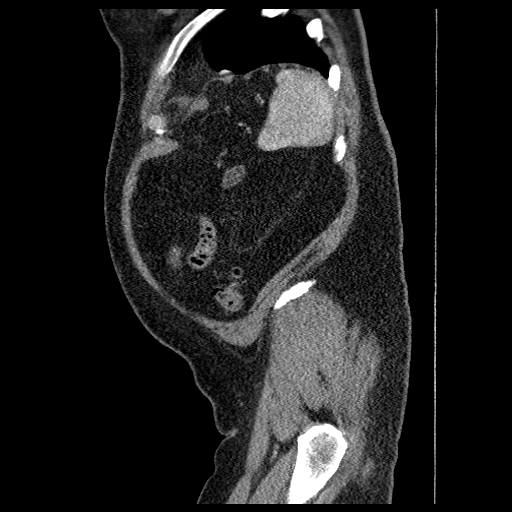

[12 of 36 positions shown; findings below may reference images not displayed]

FINDINGS: There is stable aneurysmal disease of the abdominal aorta. Beginning
at the diaphragmatic hiatus, the lower descending thoracic aorta
measures 4 cm in diameter. Juxtarenal component of abdominal aortic
aneurysm measures 4.5 x 4.8 cm. Infrarenal component measures 4.4 x
5.4 cm. There is no evidence rupture or dissection. Amount of
internal mural thrombus is stable.

The celiac axis, superior mesenteric artery, bilateral single renal
arteries and inferior mesenteric artery shows stable patency. No
progressive disease is seen involving bilateral iliac or femoral
arteries.

Reflux of contrast into the IVC and hepatic veins may be consistent
with a component of underlying right heart failure. Stable
emphysematous changes at the lung bases. Early phase
hyperenhancement in the inferior right lobe of the liver is not
visualized on today's study. No incidental masses, enlarged lymph
nodes or abnormalities involving bowel are identified. There is
steatosis of the liver. No hernias are identified. Stable
degenerative disease of the lumbar spine.

Review of the MIP images confirms the above findings.
IMPRESSION: Stable aneurysmal disease of the abdominal aorta.

## 2016-02-21 ENCOUNTER — Other Ambulatory Visit: Payer: Self-pay | Admitting: *Deleted

## 2016-02-21 DIAGNOSIS — G4700 Insomnia, unspecified: Secondary | ICD-10-CM

## 2016-02-21 MED ORDER — ALPRAZOLAM 1 MG PO TABS: 1.0000 mg | ORAL_TABLET | Freq: Three times a day (TID) | ORAL | 0 refills | Status: DC | PRN

## 2016-02-27 ENCOUNTER — Ambulatory Visit (INDEPENDENT_AMBULATORY_CARE_PROVIDER_SITE_OTHER): Payer: MEDICARE | Admitting: Internal Medicine

## 2016-02-27 ENCOUNTER — Encounter: Payer: Self-pay | Admitting: Internal Medicine

## 2016-02-27 VITALS — BP 122/60 | HR 84 | Temp 98.0°F | Resp 18 | Ht 72.0 in | Wt 220.0 lb

## 2016-02-27 DIAGNOSIS — I10 Essential (primary) hypertension: Secondary | ICD-10-CM

## 2016-02-27 DIAGNOSIS — I714 Abdominal aortic aneurysm, without rupture, unspecified: Secondary | ICD-10-CM

## 2016-02-27 DIAGNOSIS — R7303 Prediabetes: Secondary | ICD-10-CM | POA: Diagnosis not present

## 2016-02-27 DIAGNOSIS — E559 Vitamin D deficiency, unspecified: Secondary | ICD-10-CM | POA: Diagnosis not present

## 2016-02-27 DIAGNOSIS — J449 Chronic obstructive pulmonary disease, unspecified: Secondary | ICD-10-CM | POA: Diagnosis not present

## 2016-02-27 DIAGNOSIS — F329 Major depressive disorder, single episode, unspecified: Secondary | ICD-10-CM | POA: Diagnosis not present

## 2016-02-27 DIAGNOSIS — Z79899 Other long term (current) drug therapy: Secondary | ICD-10-CM | POA: Diagnosis not present

## 2016-02-27 DIAGNOSIS — E349 Endocrine disorder, unspecified: Secondary | ICD-10-CM

## 2016-02-27 DIAGNOSIS — E291 Testicular hypofunction: Secondary | ICD-10-CM | POA: Diagnosis not present

## 2016-02-27 DIAGNOSIS — E785 Hyperlipidemia, unspecified: Secondary | ICD-10-CM | POA: Diagnosis not present

## 2016-02-27 DIAGNOSIS — N320 Bladder-neck obstruction: Secondary | ICD-10-CM | POA: Diagnosis not present

## 2016-02-27 DIAGNOSIS — F32A Depression, unspecified: Secondary | ICD-10-CM

## 2016-02-27 MED ORDER — TIOTROPIUM BROMIDE MONOHYDRATE 18 MCG IN CAPS: 18.0000 ug | ORAL_CAPSULE | Freq: Every day | RESPIRATORY_TRACT | 1 refills | Status: DC

## 2016-02-27 MED ORDER — CITALOPRAM HYDROBROMIDE 40 MG PO TABS: 40.0000 mg | ORAL_TABLET | Freq: Every day | ORAL | 3 refills | Status: AC

## 2016-02-27 MED ORDER — ATORVASTATIN CALCIUM 80 MG PO TABS: ORAL_TABLET | ORAL | 1 refills | Status: AC

## 2016-02-27 MED ORDER — EZETIMIBE 10 MG PO TABS: 10.0000 mg | ORAL_TABLET | Freq: Every day | ORAL | 2 refills | Status: DC

## 2016-02-27 MED ORDER — DOXAZOSIN MESYLATE 8 MG PO TABS: ORAL_TABLET | ORAL | 2 refills | Status: DC

## 2016-02-27 MED ORDER — FLUTICASONE PROPIONATE 50 MCG/ACT NA SUSP: 1.0000 | Freq: Every day | NASAL | 3 refills | Status: AC

## 2016-02-27 MED ORDER — FLUTICASONE-SALMETEROL 250-50 MCG/DOSE IN AEPB: 1.0000 | INHALATION_SPRAY | Freq: Once | RESPIRATORY_TRACT | 11 refills | Status: AC

## 2016-02-27 NOTE — Progress Notes (Signed)
Assessment and Plan:  Hypertension:  -Continue medication,  -monitor blood pressure at home.  -Continue DASH diet.   -Reminder to go to the ER if any CP, SOB, nausea, dizziness, severe HA, changes vision/speech, left arm numbness and tingling, and jaw pain.  Cholesterol: -Continue diet and exercise.  -not at goal per last lab reading -recommend adding welchol if needed.   Pre-diabetes: -Continue diet and exercise.   Vitamin D Def: -check level -continue medications.   COPD -refilled both advair and spiriva -cont albuterol as needed -patient given both inhalers as samples here in the office  CAD with stents -being followed by [facility] -cont plavix -carry NTG with him at all times -cont cholesterol medications which are not at goal -recommend adding either welchol or a statin if patient not at LDL goal < 70.  AAA -being monitored yearly by vascular -unchanged  Testosterone def -last shot was 3 weeks ago -given not getting blood work today will recommend testosterone check at CPE in 3 weeks  Patient is due for labs but given CPE on 03/23/16 with Dr. Melford Aase will defer labs until this time.  Continue diet and meds as discussed. Further disposition pending results of labs.  HPI 75 y.o. male  presents for 3 month follow up with hypertension, hyperlipidemia, prediabetes and vitamin D.   His blood pressure has been controlled at home, today their BP is BP: 122/60.   He does not workout. He denies chest pain, shortness of breath, dizziness.  He has done some cardiac rehab.  He reports that they did take him out of it when he got his stents.     He is on cholesterol medication and denies myalgias. His cholesterol is not at goal. The cholesterol last visit was:   Lab Results  Component Value Date   CHOL 199 12/16/2014   HDL 34 (L) 12/16/2014   LDLCALC 146 (H) 12/16/2014   TRIG 97 12/16/2014   CHOLHDL 5.9 12/16/2014     He has been working on diet and exercise for  prediabetes, and denies foot ulcerations, hyperglycemia, hypoglycemia , increased appetite, nausea, paresthesia of the feet, polydipsia, polyuria, visual disturbances, vomiting and weight loss. Last A1C in the office was:  Lab Results  Component Value Date   HGBA1C 6.0 (H) 12/16/2014    Patient is on Vitamin D supplement.  Lab Results  Component Value Date   VD25OH 64 12/16/2014      He reports that he has been seeing cardiology for his multiple stents and also with his atrial fibrillation.    He reports that he is not taking either his advair or his spiriva.  He reports that he has been out of these for a while.  He is using a nebulizer machine once daily.  He does need some albuterol for his nebulizer machine.  He has been out of his medication now for 3 weeks.  He uses this once daily.  He doesn't ever use this more than once per day.    He reports that he also needs some flonase.  He reports that he has been having issues with his allergies.   Current Medications:  Current Outpatient Prescriptions on File Prior to Visit  Medication Sig Dispense Refill  . ALPRAZolam (XANAX) 1 MG tablet Take 1 tablet (1 mg total) by mouth 3 (three) times daily as needed. 20 tablet 0  . aspirin EC 81 MG tablet Take by mouth.    . Cholecalciferol (VITAMIN D3) 5000 UNITS CAPS Take 2  caps (10,000) units daily    . clopidogrel (PLAVIX) 75 MG tablet     . furosemide (LASIX) 20 MG tablet Take 20 mg by mouth daily.    Marland Kitchen loratadine-pseudoephedrine (CLARITIN-D 12-HOUR) 5-120 MG per tablet Take 1 tablet by mouth 2 (two) times daily.    . Multiple Vitamin (MULTIVITAMIN) capsule Take 1 capsule by mouth daily.      Marland Kitchen oxybutynin (DITROPAN) 5 MG tablet Take 1 tablet 3 x day as needed for Overactive Bladder 270 tablet 3  . spironolactone (ALDACTONE) 25 MG tablet Take 25 mg by mouth daily. Takes 1/2  (25 mg tablet) tablet daily.    . tamsulosin (FLOMAX) 0.4 MG CAPS capsule Take 1 capsule by mouth  every morning for  prostate 90 capsule 0  . testosterone cypionate (DEPO-TESTOSTERONE) 200 MG/ML injection Inject 2 cc intra muscular every 2 weeks 10 mL 0  . [DISCONTINUED] fluticasone (VERAMYST) 27.5 MCG/SPRAY nasal spray Place 2 sprays into the nose daily.     No current facility-administered medications on file prior to visit.     Medical History:  Past Medical History:  Diagnosis Date  . AAA (abdominal aortic aneurysm) ([place])   . Aneurysm ([place])   . Arthritis   . Asthma   . BPH (benign prostatic hyperplasia)   . Bronchitis   . 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 CHF (congestive heart failure) ([place])   . COPD (chronic obstructive pulmonary disease) ([place])   . DDD (degenerative disc disease)   . Depression   . Depression with anxiety   . Edema    bilateral lower extremities  . GERD (gastroesophageal reflux disease)   . Hiatal hernia   . Hyperlipidemia   . Hypertension   . Prediabetes     Allergies: No Known Allergies   Review of Systems:  Review of Systems  Constitutional: Negative for chills, fever and malaise/fatigue.  HENT: Negative for congestion, ear pain and sore throat.   Eyes: Negative.   Respiratory: Positive for shortness of breath and wheezing. Negative for cough.   Cardiovascular: Negative for chest pain, palpitations and leg swelling.  Gastrointestinal: Negative for abdominal pain, blood in stool, constipation, diarrhea, heartburn and melena.  Genitourinary: Negative.   Skin: Negative.   Neurological: Negative for dizziness, sensory change, loss of consciousness and headaches.  Psychiatric/Behavioral: Negative for depression. The patient is not nervous/anxious and does not have insomnia.     Family history- Review and unchanged  Social history- Review and unchanged  Physical Exam: BP 122/60   Pulse 84   Temp 98 F (36.7 C) (Temporal)   Resp 18   Ht 6' (1.829 m)   Wt 220 lb (99.8 kg)   BMI 29.84 kg/m  Wt Readings from Last 3 Encounters:  02/27/16 220 lb  (99.8 kg)  09/13/15 215 lb (97.5 kg)  03/08/15 222 lb 6.4 oz (100.9 kg)    General Appearance: Well nourished well developed, in no apparent distress. Eyes: PERRLA, EOMs, conjunctiva no swelling or erythema ENT/Mouth: Ear canals normal without obstruction, swelling, erythma, discharge.  TMs normal bilaterally.  Oropharynx moist, clear, without exudate, or postoropharyngeal swelling. Neck: Supple, thyroid normal,no cervical adenopathy  Respiratory: Respiratory effort normal, Breath sounds clear A&P without rhonchi, wheeze, or rale.  No retractions, no accessory usage. Cardio: RRR with no MRGs. Brisk peripheral pulses without edema.  Abdomen: Soft, + BS,  Non tender, no guarding, rebound, hernias, masses. Musculoskeletal: Full ROM, 5/5 strength, Normal gait Skin: Warm, dry without  rashes, lesions, ecchymosis.  Neuro: Awake and oriented X 3, Cranial nerves intact. Normal muscle tone, no cerebellar symptoms. Psych: Normal affect, Insight and Judgment appropriate.    Starlyn Skeans, PA-C 5:08 PM [facility]

## 2016-03-23 ENCOUNTER — Encounter: Payer: Self-pay | Admitting: Internal Medicine

## 2016-03-29 ENCOUNTER — Ambulatory Visit: Payer: Self-pay | Admitting: Family

## 2016-03-29 ENCOUNTER — Other Ambulatory Visit (HOSPITAL_COMMUNITY): Payer: Self-pay

## 2016-05-10 ENCOUNTER — Other Ambulatory Visit: Payer: Self-pay | Admitting: *Deleted

## 2016-05-10 MED ORDER — ALPRAZOLAM 1 MG PO TABS: 1.0000 mg | ORAL_TABLET | Freq: Three times a day (TID) | ORAL | 0 refills | Status: DC | PRN

## 2016-07-02 ENCOUNTER — Other Ambulatory Visit: Payer: Self-pay | Admitting: Internal Medicine

## 2016-07-02 DIAGNOSIS — N320 Bladder-neck obstruction: Secondary | ICD-10-CM

## 2016-07-13 ENCOUNTER — Encounter: Payer: Self-pay | Admitting: Family

## 2016-07-20 ENCOUNTER — Encounter: Payer: Self-pay | Admitting: Family

## 2016-07-20 ENCOUNTER — Other Ambulatory Visit: Payer: Self-pay

## 2016-07-20 ENCOUNTER — Ambulatory Visit (HOSPITAL_COMMUNITY)
Admission: RE | Admit: 2016-07-20 | Discharge: 2016-07-20 | Disposition: A | Discharge status: Home / Self Care | Payer: MEDICARE | Source: Ambulatory Visit | Attending: Family | Admitting: Family

## 2016-07-20 ENCOUNTER — Ambulatory Visit (INDEPENDENT_AMBULATORY_CARE_PROVIDER_SITE_OTHER): Payer: MEDICARE | Admitting: Family

## 2016-07-20 VITALS — BP 98/63 | HR 61 | Temp 97.6°F | Resp 18 | Ht 72.0 in | Wt 227.9 lb

## 2016-07-20 DIAGNOSIS — I714 Abdominal aortic aneurysm, without rupture, unspecified: Secondary | ICD-10-CM

## 2016-07-20 NOTE — Patient Instructions (Addendum)
Abdominal Aortic Aneurysm Blood pumps away from the heart through tubes (blood vessels) called arteries. Aneurysms are weak or damaged places in the wall of an artery. It bulges out like a balloon. An abdominal aortic aneurysm happens in the main artery of the body (aorta). It can burst or tear, causing bleeding inside the body. This is an emergency. It needs treatment right away. What are the causes? The exact cause is unknown. Things that could cause this problem include:  Fat and other substances building up in the lining of a tube.  Swelling of the walls of a blood vessel.  Certain tissue diseases.  Belly (abdominal) trauma.  An infection in the main artery of the body. What increases the risk? There are things that make it more likely for you to have an aneurysm. These include:  Being over the age of 77 years old.  Having high blood pressure (hypertension).  Being a male.  Being white.  Being very overweight (obese).  Having a family history of aneurysm.  Using tobacco products. What are the signs or symptoms? Symptoms depend on the size of the aneurysm and how fast it grows. There may not be symptoms. If symptoms occur, they can include:  Pain (belly, side, lower back, or groin).  Feeling full after eating a small amount of food.  Feeling sick to your stomach (nauseous), throwing up (vomiting), or both.  Feeling a lump in your belly that feels like it is beating (pulsating).  Feeling like you will pass out (faint). How is this treated?  Medicine to control blood pressure and pain.  Imaging tests to see if the aneurysm gets bigger.  Surgery. How is this prevented? To lessen your chance of getting this condition:  Stop smoking. Stop chewing tobacco.  Limit or avoid alcohol.  Keep your blood pressure, blood sugar, and cholesterol within normal limits.  Eat less salt.  Eat foods low in saturated fats and cholesterol. These are found in animal and whole  dairy products.  Eat more fiber. Fiber is found in whole grains, vegetables, and fruits.  Keep a healthy weight.  Stay active and exercise often. This information is not intended to replace advice given to you by your health care provider. Make sure you discuss any questions you have with your health care provider. Document Released: 09/22/2012 Document Revised: 11/03/2015 Document Reviewed: 06/27/2012 Elsevier Interactive Patient Education  2017 Elsevier Inc.      Before your next abdominal ultrasound:  Take two Extra-Strength Gas-X capsules at bedtime the night before the test. Take another two Extra-Strength Gas-X capsules 3 hours before the test.   

## 2016-07-20 NOTE — Progress Notes (Signed)
[facility] [place]   CC: Follow up Abdominal Aortic Aneurysm  History of Present Illness  Walter Snyder is a 76 y.o. (Feb 03, 1941) male patient of Dr. Donnetta Hutching who returns today for continued follow-up of his abdominal aortic aneurysm. He reports that he has been diagnosed with congestive heart failure. He had some improvement in his breathing with treatment. Reports that he had more swelling in his legs which is resolved as well. He has no symptoms regarding his aneurysm. Dr. Donnetta Hutching last evaluated pt on 03/08/15. At that time Dr. Donnetta Hutching explained again that pt does have diffuse arteriomegaly. His non- aneurysmal aorta at the level of the diaphragm in the renal arteries is 4 cm. Explained that the 5.6 cm aneurysmal dilatation should be less risky with his generalized arteriomegaly then was of normal-size aorta. Dr. Donnetta Hutching did not feel that pt has any benefit for treatment versus observation at this time. This is particularly in light of his exacerbation of congestive heart failure.   Pt returns today for 6 months follow up of his AAA.  The patient denies claudication in legs with walking. The patient denies history of stroke or TIA symptoms.  Pt states his c-spine and legs were checked in 2017 for drawing sensation in his neck and legs; states he was told he has degenerative back disease, spurs on his spine.  He reports worsening low back pain that is alleviated by sitting. He denies abdominal pain.   Pt states that he had a CT angiogram recently and Dr. Donnetta Hutching told him he is not a candidate for an EVAR.  Pt reports he had 3 cardiac stents placed in December 2016; states he was placed on Plavix for a year and also 81 mg ASA which have both been discontinued.   Pt Diabetic: No Pt smoker: former smoker, quit in 1984    Past Medical History:  Diagnosis Date  . AAA (abdominal aortic aneurysm) ([place])   . Aneurysm ([place])   . Arthritis   . Asthma   . BPH (benign prostatic  hyperplasia)   . Bronchitis   . 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 CHF (congestive heart failure) ([place])   . COPD (chronic obstructive pulmonary disease) ([place])   . DDD (degenerative disc disease)   . Depression   . Depression with anxiety   . Edema    bilateral lower extremities  . GERD (gastroesophageal reflux disease)   . Hiatal hernia   . Hyperlipidemia   . Hypertension   . Prediabetes    Past Surgical History:  Procedure Laterality Date  . [place]  Dec. 12, 2014   BCC- Back   ( Basal cell carcinoma )  . CORONARY STENT PLACEMENT    . CYST EXCISION     back  . KNEE ARTHROSCOPY     left  . lens implant     bilateral   Social History Social History   Social History  . Marital status: Married    Spouse name: N/A  . Number of children: N/A  . Years of education: N/A   Occupational History  . Not on file.   Social History Main Topics  . Smoking status: Former Smoker    Types: Cigarettes    Quit date: 06/11/1992  . Smokeless tobacco: Never Used  . Alcohol use No  . Drug use: No  . Sexual activity: Not on file   Other Topics Concern  . Not on file   Social History  Narrative  . No narrative on file   Family History Family History  Problem Relation Age of Onset  . Other Sister     AAA  . Heart disease Mother   . Diabetes Mother   . Heart attack Mother   . Heart disease Father   . Cancer Brother   . Diabetes Brother   . Heart disease Brother     before age 73  . Varicose Veins Brother   . Heart attack Brother   . Heart disease Son     before age 56    Current Outpatient Prescriptions on File Prior to Visit  Medication Sig Dispense Refill  . ALPRAZolam (XANAX) 1 MG tablet Take 1 tablet (1 mg total) by mouth 3 (three) times daily as needed. 20 tablet 0  . atorvastatin (LIPITOR) 80 MG tablet Take 1 daily titrate as directed for cholesterol 90 tablet 1  . Cholecalciferol (VITAMIN D3) 5000 UNITS CAPS Take 2 caps (10,000) units daily    . citalopram  (CELEXA) 40 MG tablet Take 1 tablet (40 mg total) by mouth daily. FOR Mood 90 tablet 3  . clopidogrel (PLAVIX) 75 MG tablet     . doxazosin (CARDURA) 8 MG tablet TAKE 1 TABLET BY MOUTH  DAILY FOR PROSTATE AND FOR  BLOOD PRESSURE 90 tablet 2  . ezetimibe (ZETIA) 10 MG tablet Take 1 tablet (10 mg total) by mouth daily. 90 tablet 2  . fluticasone (FLONASE) 50 MCG/ACT nasal spray Place 1 spray into both nostrils daily. 48 g 3  . furosemide (LASIX) 20 MG tablet Take 20 mg by mouth daily.    Marland Kitchen loratadine-pseudoephedrine (CLARITIN-D 12-HOUR) 5-120 MG per tablet Take 1 tablet by mouth 2 (two) times daily.    . Multiple Vitamin (MULTIVITAMIN) capsule Take 1 capsule by mouth daily.      Marland Kitchen oxybutynin (DITROPAN) 5 MG tablet Take 1 tablet 3 x day as needed for Overactive Bladder 270 tablet 3  . spironolactone (ALDACTONE) 25 MG tablet Take 25 mg by mouth daily. Takes 1/2  (25 mg tablet) tablet daily.    . tamsulosin (FLOMAX) 0.4 MG CAPS capsule TAKE 1 CAPSULE BY MOUTH  EVERY MORNING FOR PROSTATE 90 capsule 1  . testosterone cypionate (DEPO-TESTOSTERONE) 200 MG/ML injection Inject 2 cc intra muscular every 2 weeks 10 mL 0  . tiotropium (SPIRIVA) 18 MCG inhalation capsule Place 1 capsule (18 mcg total) into inhaler and inhale daily. 90 capsule 1  . aspirin EC 81 MG tablet Take by mouth.    . Fluticasone-Salmeterol (ADVAIR DISKUS) 250-50 MCG/DOSE AEPB Inhale 1 puff into the lungs once. One inhalation 9 am 60 each 11  . [DISCONTINUED] fluticasone (VERAMYST) 27.5 MCG/SPRAY nasal spray Place 2 sprays into the nose daily.     No current facility-administered medications on file prior to visit.    No Known Allergies  ROS: See HPI for pertinent positives and negatives.  Physical Examination  Vitals:   07/20/16 0955  BP: 98/63  Pulse: 61  Resp: 18  Temp: 97.6 F (36.4 C)  TempSrc: Oral  SpO2: 95%  Weight: 227 lb 14.4 oz (103.4 kg)  Height: 6' (1.829 m)   Body mass index is 30.91 kg/m.  General: A&O x  3, WD, overweight, central adiposity.  Pulmonary: Sym exp, non labored respirations, adequate air movt throughout, CTAB Cardiac: RRR, Nl S1, S2, no Murmurs, rubs or gallops appreciated.  Carotid Bruits Left Right   Negative Negative  Aorta is not palpable Radial pulses are  2+ and equal.   VASCULAR EXAM:     LE Pulses LEFT RIGHT   FEMORAL not palpable, obese Not palpable, obese    POPLITEAL not palpable  not palpable   POSTERIOR TIBIAL not palpable  not  palpable    DORSALIS PEDIS  ANTERIOR TIBIAL palpable  palpable     Gastrointestinal: soft, NTND, -G/R, - HSM, asymptomatic reducible ventral hernia,  - CVAT B.  Musculoskeletal: M/S 5/5 throughout, Extremities without ischemic changes.   Neurologic: CN 2-12 intact, Pain and light touch intact in extremities, Motor exam as listed above   CT angiogram 02/04/12: Cardiomegaly and 1. Interval increase in bilobed fusiform aneurysmal dilatation of  the entirety of the abdominal aorta. The juxtarenal component of  the abdominal aortic aneurysm now measures approximately 4.3 x 4.8  cm (previously, 3.9 x 4.6 cm), while the infrarenal component of  the abdominal aortic aneurysm now measures approximately 5.0 x 4.4  cm (previously, 4.9 x 4.2 cm).  2. The amount of irregular mural thrombus within the abdominal  aorta has likely increased in the interval, however does not result  in hemodynamically significant narrowing. No evidence of abdominal  aortic dissection or periaortic stranding   Non-Invasive Vascular Imaging  AAA Duplex (07/20/2016)  Previous size: 5.35 cm (Date: 09-13-15)  Current size:  5.36 cm, multilobular (Date: 07-20-16) Right CIA: 1.53 cm; Left CIA: 1.82 cm. Plaque not visualized, limited  visualization due to body habitus and bowel gas.   Medical Decision Making  The patient is a 76 y.o. male wwho has known diffuse abdominal aorta arterio megaly. He has no sx's referable to his abdominal aorta. Today's AAA duplex suggests a technically difficult study due to excessive bowel gas and body habitus. Abdominal aorta arteriomegally with a maximum diameter of 5.36 cm. No significant change compared to last exam on 09-13-15.    Based on this patient's exam and diagnostic studies, the patient will follow up in 6 months  with the following studies: AAA duplex.  Dr. Hiram Gash, [facility], 02-01-16 (review of records): Abdominal aortic aneurysm (AAA) without rupture (CMS-HCC) We will arrange for CTA to evaluate his abdominal aortic dimensions once when he was creatinine.   [facility] appears to be monitoring pt's AAA with CTA. But at pt's visit with Dr. Hiram Gash on 05-09-16, no mention of CTA abd/pelvis result nor addressing AAA mentioned. According to the progress note dated 05-09-16, [place] [facility] is managing pt's heart failure, CAD, and atrial fib.  Pt states that he did have a CTA of his abdomen about August 2017, and was told that he he not a candidate for stent graft repair.    Based on this patient's exam and diagnostic studies, the patient will follow up in 4 months  with the following studies: AAA duplex, see Dr. Donnetta Hutching afterward.    Consideration for repair of AAA would be made when the size is 5.5 cm, growth > 1 cm/yr, and symptomatic status.  I emphasized the importance of maximal medical management including strict control of blood pressure, blood glucose, and lipid levels, antiplatelet agents, obtaining regular exercise, and continued cessation of smoking.   The patient was given information about AAA including signs, symptoms, treatment, and how to minimize the risk of enlargement and rupture of aneurysms.    The patient was advised to call 911 should the patient  experience sudden onset abdominal or back pain.   Thank you for allowing [place] to participate in this patient's care.  Clemon Chambers, RN, MSN,  FNP-C [facility] [place] Office: [facility] Physician: Donzetta Matters  07/20/2016, 10:06 AM

## 2016-07-30 ENCOUNTER — Encounter: Payer: Self-pay | Admitting: Internal Medicine

## 2016-07-30 ENCOUNTER — Ambulatory Visit (INDEPENDENT_AMBULATORY_CARE_PROVIDER_SITE_OTHER): Payer: MEDICARE | Admitting: Internal Medicine

## 2016-07-30 VITALS — BP 118/68 | HR 68 | Temp 97.5°F | Resp 16 | Ht 72.0 in | Wt 224.6 lb

## 2016-07-30 DIAGNOSIS — J42 Unspecified chronic bronchitis: Secondary | ICD-10-CM

## 2016-07-30 DIAGNOSIS — J3089 Other allergic rhinitis: Secondary | ICD-10-CM

## 2016-07-30 DIAGNOSIS — E782 Mixed hyperlipidemia: Secondary | ICD-10-CM | POA: Diagnosis not present

## 2016-07-30 DIAGNOSIS — E559 Vitamin D deficiency, unspecified: Secondary | ICD-10-CM | POA: Diagnosis not present

## 2016-07-30 DIAGNOSIS — Z125 Encounter for screening for malignant neoplasm of prostate: Secondary | ICD-10-CM

## 2016-07-30 DIAGNOSIS — R7303 Prediabetes: Secondary | ICD-10-CM

## 2016-07-30 DIAGNOSIS — Z136 Encounter for screening for cardiovascular disorders: Secondary | ICD-10-CM

## 2016-07-30 DIAGNOSIS — I4891 Unspecified atrial fibrillation: Secondary | ICD-10-CM

## 2016-07-30 DIAGNOSIS — I714 Abdominal aortic aneurysm, without rupture, unspecified: Secondary | ICD-10-CM

## 2016-07-30 DIAGNOSIS — I10 Essential (primary) hypertension: Secondary | ICD-10-CM

## 2016-07-30 DIAGNOSIS — E349 Endocrine disorder, unspecified: Secondary | ICD-10-CM | POA: Diagnosis not present

## 2016-07-30 DIAGNOSIS — K219 Gastro-esophageal reflux disease without esophagitis: Secondary | ICD-10-CM | POA: Diagnosis not present

## 2016-07-30 DIAGNOSIS — Z1212 Encounter for screening for malignant neoplasm of rectum: Secondary | ICD-10-CM

## 2016-07-30 DIAGNOSIS — Z79899 Other long term (current) drug therapy: Secondary | ICD-10-CM | POA: Diagnosis not present

## 2016-07-30 LAB — HEPATIC FUNCTION PANEL
ALT: 13 U/L (ref 9–46)
AST: 15 U/L (ref 10–35)
Albumin: 4.1 g/dL (ref 3.6–5.1)
Alkaline Phosphatase: 91 U/L (ref 40–115)
Bilirubin, Direct: 0.1 mg/dL (ref <=0.2)
Indirect Bilirubin: 0.5 mg/dL (ref 0.2–1.2)
TOTAL PROTEIN: 6.7 g/dL (ref 6.1–8.1)
Total Bilirubin: 0.6 mg/dL (ref 0.2–1.2)

## 2016-07-30 LAB — BASIC METABOLIC PANEL WITH GFR
BUN: 24 mg/dL (ref 7–25)
CALCIUM: 9.3 mg/dL (ref 8.6–10.3)
CO2: 30 mmol/L (ref 20–31)
CREATININE: 1.28 mg/dL — AB (ref 0.70–1.18)
Chloride: 103 mmol/L (ref 98–110)
GFR, Est African American: 62 mL/min (ref >=60)
GFR, Est Non African American: 54 mL/min — ABNORMAL LOW (ref >=60)
GLUCOSE: 100 mg/dL — AB (ref 65–99)
Potassium: 4.5 mmol/L (ref 3.5–5.3)
Sodium: 141 mmol/L (ref 135–146)

## 2016-07-30 LAB — HEMOGLOBIN A1C
HEMOGLOBIN A1C: 5.6 % (ref <5.7)
Mean Plasma Glucose: 114 mg/dL

## 2016-07-30 LAB — CBC WITH DIFFERENTIAL/PLATELET
BASOS ABS: 66 {cells}/uL (ref 0–200)
Basophils Relative: 1 %
EOS PCT: 3 %
Eosinophils Absolute: 198 cells/uL (ref 15–500)
HEMATOCRIT: 41.9 % (ref 38.5–50.0)
HEMOGLOBIN: 13.7 g/dL (ref 13.2–17.1)
LYMPHS ABS: 1254 {cells}/uL (ref 850–3900)
Lymphocytes Relative: 19 %
MCH: 30.7 pg (ref 27.0–33.0)
MCHC: 32.7 g/dL (ref 32.0–36.0)
MCV: 93.9 fL (ref 80.0–100.0)
MPV: 9.8 fL (ref 7.5–12.5)
Monocytes Absolute: 594 cells/uL (ref 200–950)
Monocytes Relative: 9 %
NEUTROS PCT: 68 %
Neutro Abs: 4488 cells/uL (ref 1500–7800)
Platelets: 259 10*3/uL (ref 140–400)
RBC: 4.46 MIL/uL (ref 4.20–5.80)
RDW: 13.4 % (ref 11.0–15.0)
WBC: 6.6 10*3/uL (ref 3.8–10.8)

## 2016-07-30 LAB — LIPID PANEL
CHOLESTEROL: 206 mg/dL — AB (ref <200)
HDL: 39 mg/dL — ABNORMAL LOW (ref >40)
LDL Cholesterol: 142 mg/dL — ABNORMAL HIGH (ref <100)
Total CHOL/HDL Ratio: 5.3 Ratio — ABNORMAL HIGH (ref <5.0)
Triglycerides: 127 mg/dL (ref <150)
VLDL: 25 mg/dL (ref <30)

## 2016-07-30 LAB — PSA: PSA: 0.6 ng/mL (ref <=4.0)

## 2016-07-30 LAB — TSH: TSH: 1.6 mIU/L (ref 0.40–4.50)

## 2016-07-30 MED ORDER — PREDNISONE 20 MG PO TABS: ORAL_TABLET | ORAL | 1 refills | Status: AC

## 2016-07-30 MED ORDER — IPRATROPIUM BROMIDE 0.03 % NA SOLN: NASAL | 1 refills | Status: AC

## 2016-07-30 NOTE — Progress Notes (Signed)
[facility]   Unk Pinto, M.D.    Uvaldo Bristle. Silverio Lay, P.A.-C      Starlyn Skeans, P.A.-C  [facility]                [address] [place], [place] [id number] Telephone [phone] Telefax [phone]  Comprehensive Evaluation & Examination     This very nice 76 y.o. MWM presents for a  comprehensive evaluation and management of multiple medical co-morbidities.  Patient has been followed for HTN, T2_NIDDM  Prediabetes, Hyperlipidemia and Vitamin D Deficiency.     HTN predates circa 1990. Patient's BP has been controlled at home.  Today's BP is at goal - 118/68. Patient denies any cardiac symptoms as chest pain, palpitations, shortness of breath, dizziness or ankle swelling. In June 2017, he was dx'd w/Afib, started on Eliquis and cardioverted at [facility].  Apparently he was started on Amiodarone  which he self d/c'd for concern of some side effect. Then on May 17, 2016, Patient had Stents x 3 planted at [facility] and is followed biannually at [facility].  Patient does have an AAA followed annually by Dr Donnetta Hutching.      Patient's hyperlipidemia is not controlled with diet as he is intolerant to Pravastatin and it's unclear whether he's taking 1/2 tab Atorvastatin daily. Also he  has been taking his Zetia sporadically. Patient denies myalgias or other medication SE's.  Last lipids were not at goal: Lab Results  Component Value Date   CHOL 199 12/16/2014   HDL 34 (L) 12/16/2014   LDLCALC 146 (H) 12/16/2014   TRIG 97 12/16/2014   CHOLHDL 5.9 12/16/2014      Patient has prediabetes (A1c 5.9% in 2010 and 6.1% in 2013) and patient denies reactive hypoglycemic symptoms, visual blurring, diabetic polys or paresthesias. Last A1c was  Lab Results  Component Value Date   HGBA1C 6.0 (H) 12/16/2014       Finally, patient has history of Vitamin D Deficiency ("34" in 2010). As he take his recommended Vit D only  sporadically, his last vitamin D was still low: Lab Results  Component Value Date   VD25OH 38 12/16/2014   Current Outpatient Prescriptions on File Prior to Visit  Medication Sig  . ALPRAZolam  1 MG tablet Take 1 tab 3  times daily as needed.  Marland Kitchen Apixaban / ELIQUIS5 MG  Take by mouth.  Marland Kitchen atorvastatin  80 MG tablet Take 1 daily titrate as directed for cholesterol  . VITAMIN D 5000 UNITS CAPS Take 2 caps (10,000) units daily  . citalopram  40 MG tablet Take 1 tab daily. FOR Mood  . doxazosin  8 MG tablet TAKE 1 TAB  DAILY   . FLONASE nasal spray Place 1 spray into both nostrils daily.  . furosemide 20 MG tablet Take 20 mg by mouth daily.  . Multiple Vitamin  Take 1 capsule by mouth daily.    Marland Kitchen oxybutynin 5 MG tablet Take 1 tablet 3 x day as needed for Overactive Bladder  . spironolactone  25 MG tablet Takes 1/2  (25 mg tablet) tablet daily.  . tamsulosin 0.4 MG CAPS  TAKE 1 CAPSULE BY MOUTH  EVERY MORNING  . testosterone cypio 200 MG/ML Inject 2 cc intra muscular every 2 weeks  . clopidogrel  75 MG tablet   . ADVAIR DISKUS 250-50  Inhale 1 puff into  the lungs once. One inhalation 9 am   No Known Allergies   Past Medical History:  Diagnosis Date  . AAA (abdominal aortic aneurysm) ([place])   . Aneurysm ([place])   . Arthritis   . Asthma   . BPH (benign prostatic hyperplasia)   . Bronchitis   . 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 CHF (congestive heart failure) ([place])   . COPD (chronic obstructive pulmonary disease) ([place])   . DDD (degenerative disc disease)   . Depression   . Depression with anxiety   . Edema    bilateral lower extremities  . GERD (gastroesophageal reflux disease)   . Hiatal hernia   . Hyperlipidemia   . Hypertension   . Prediabetes    Health Maintenance  Topic Date Due  . TETANUS/TDAP  12/22/1958  . PNA vac Low Risk Adult (2 of 2 - PCV13) 01/21/2008  . INFLUENZA VACCINE  01/10/2016   Immunization History  Administered Date(s) Administered  . DT  01/05/2004  . Influenza Split 02/24/2013  . Influenza-Unspecified 03/11/2014  . Pneumococcal Polysaccharide-23 01/21/2007   Past Surgical History:  Procedure Laterality Date  . [place]  Dec. 12, 2014   BCC- Back   ( Basal cell carcinoma )  . CORONARY STENT PLACEMENT    . CYST EXCISION     back  . KNEE ARTHROSCOPY     left  . lens implant     bilateral   Family History  Problem Relation Age of Onset  . Other Sister     AAA  . Heart disease Mother   . Diabetes Mother   . Heart attack Mother   . Heart disease Father   . Cancer Brother   . Diabetes Brother   . Heart disease Brother     before age 74  . Varicose Veins Brother   . Heart attack Brother   . Heart disease Son     before age 24   Social History  . Marital status: Married   Occupational History  . Retired Scientist, research (physical sciences)   Social History Main Topics  . Smoking status: Former Smoker    Types: Cigarettes    Quit date: 06/11/1992  . Smokeless tobacco: Never Used  . Alcohol use No  . Drug use: No  . Sexual activity: Not on file    ROS Constitutional: Denies fever, chills, weight loss/gain, headaches, insomnia,  night sweats or change in appetite. Does c/o fatigue. Eyes: Denies redness, blurred vision, diplopia, discharge, itchy or watery eyes.  ENT: Denies discharge, epistaxis, sore throat, earache, hearing loss, dental pain, Tinnitus, Vertigo, Sinus pain or snoring.  C/o watery post nasal drip. Cardio: Denies chest pain, palpitations, irregular heartbeat, syncope, dyspnea, diaphoresis, orthopnea, PND, claudication or edema Respiratory: denies cough, dyspnea, DOE, pleurisy, hoarseness, laryngitis or wheezing.  Gastrointestinal: Denies dysphagia, heartburn, reflux, water brash, pain, cramps, nausea, vomiting, bloating, diarrhea, constipation, hematemesis, melena, hematochezia, jaundice or hemorrhoids Genitourinary: Denies dysuria, frequency, urgency, nocturia, hesitancy, discharge, hematuria or flank pain Musculoskeletal:  Denies arthralgia, myalgia, stiffness, Jt. Swelling, pain, limp or strain/sprain. Denies Falls. Skin: Denies puritis, rash, hives, warts, acne, eczema or change in skin lesion Neuro: No weakness, tremor, incoordination, spasms, paresthesia or pain Psychiatric: Denies confusion, memory loss or sensory loss. Denies Depression. Endocrine: Denies change in weight, skin, hair change, nocturia, and paresthesia, diabetic polys, visual blurring or hyper / hypo glycemic episodes.  Heme/Lymph: No excessive bleeding, bruising or enlarged lymph nodes.  Physical Exam  BP 118/68  Pulse 68   Temp 97.5 F (36.4 C)   Resp 16   Ht 6' (1.829 m)   Wt 224 lb 9.6 oz (101.9 kg)   BMI 30.46 kg/m   General Appearance: Well nourished, in no apparent distress.  Eyes: PERRLA, EOMs, conjunctiva no swelling or erythema, normal fundi and vessels. Sinuses: No frontal/maxillary tenderness ENT/Mouth: EACs patent / TMs  nl. Nares clear without erythema, swelling, mucoid exudates. Oral hygiene is good. No erythema, swelling, or exudate. Tongue normal, non-obstructing. Tonsils not swollen or erythematous. Hearing normal.  Neck: Supple, thyroid normal. No bruits, nodes or JVD. Respiratory: Respiratory effort normal.  BS equal and clear bilateral without rales, rhonci, wheezing or stridor. Cardio: Heart sounds are normal with regular rate and rhythm and no murmurs, rubs or gallops. Peripheral pulses are normal and equal bilaterally without edema. No aortic or femoral bruits. Chest: symmetric with normal excursions and percussion.  Abdomen: Soft, with Nl bowel sounds. Nontender, no guarding, rebound, hernias, masses, or organomegaly.  Lymphatics: Non tender without lymphadenopathy.  Genitourinary: No hernias.Testes nl. DRE - prostate nl for age - smooth & firm w/o nodules. Musculoskeletal: Full ROM all peripheral extremities, joint stability, 5/5 strength, and normal gait. Skin: Warm and dry without rashes, lesions,  cyanosis, clubbing or  ecchymosis.  Neuro: Cranial nerves intact, reflexes equal bilaterally. Normal muscle tone, no cerebellar symptoms. Sensation intact.  Pysch: Alert and oriented X 3 with normal affect, insight and judgment appropriate.   Assessment and Plan  1. Essential hypertension  - Microalbumin / creatinine urine ratio - EKG 12-Lead - Urinalysis, Routine w reflex microscopic - CBC with Differential/Platelet - BASIC METABOLIC PANEL WITH GFR - TSH  2. Mixed hyperlipidemia  - ezetimibe (ZETIA) 10 MG tablet - EKG 12-Lead - Hepatic function panel - Lipid panel - TSH  3. Prediabetes  - EKG 12-Lead - Hemoglobin A1c - Insulin, random  4. Vitamin D deficiency  - VITAMIN D 25 Hydroxy   5. Atrial fibrillation ([place])   6. Abdominal aortic aneurysm (AAA) without rupture ([place])   7. Testosterone deficiency  - Testosterone  8. Gastroesophageal reflux disease   9. Screening for rectal cancer  - POC Hemoccult Bld/Stl   10. Prostate cancer screening  - PSA  11. Screening for ischemic heart disease  - EKG 12-Lead  12. Chronic bronchitis (HCC)  - tiotropium (SPIRIVA) 18 MCG inhalation capsule; Place into inhaler and inhale.  13. Medication management  - Urinalysis, Routine w reflex microscopic - CBC with Differential/Platelet - BASIC METABOLIC PANEL WITH GFR - Hepatic function panel - Magnesium - Lipid panel - TSH - Hemoglobin A1c - Insulin, random - VITAMIN D 25 Hydroxy   14. Acute nonseasonal allergic rhinitis due to other allergen  - ipratropium (ATROVENT) 0.03 % nasal spray; Use 1 to 2 nasal sprays 3 to 4 x /day for nasal drainage  Dispense: 90 mL; Refill: 1 - predniSONE (DELTASONE) 20 MG tablet; 1 tab 3 x day for 3 days, then 1 tab 2 x day for 3 days, then 1 tab 1 x day for 5 days  Dispense: 20 tablet; Refill: 1       Continue prudent diet as discussed, weight control, BP monitoring, regular exercise, and medications as discussed.  Discussed  med effects and SE's. Routine screening labs and tests as requested with regular follow-up as recommended. Over 40 minutes of exam, counseling, chart review and high complex critical decision making was performed

## 2016-07-30 NOTE — Patient Instructions (Signed)

## 2016-07-31 LAB — URINALYSIS, ROUTINE W REFLEX MICROSCOPIC
BILIRUBIN URINE: NEGATIVE
Glucose, UA: NEGATIVE
HGB URINE DIPSTICK: NEGATIVE
KETONES UR: NEGATIVE
Leukocytes, UA: NEGATIVE
Nitrite: NEGATIVE
Protein, ur: NEGATIVE
SPECIFIC GRAVITY, URINE: 1.011 (ref 1.001–1.035)
pH: 5.5 (ref 5.0–8.0)

## 2016-07-31 LAB — MAGNESIUM: Magnesium: 2 mg/dL (ref 1.5–2.5)

## 2016-07-31 LAB — VITAMIN D 25 HYDROXY (VIT D DEFICIENCY, FRACTURES): VIT D 25 HYDROXY: 31 ng/mL (ref 30–100)

## 2016-07-31 LAB — TESTOSTERONE: TESTOSTERONE: 216 ng/dL — AB (ref 250–827)

## 2016-07-31 LAB — MICROALBUMIN / CREATININE URINE RATIO
CREATININE, URINE: 60 mg/dL (ref 20–370)
MICROALB UR: 0.7 mg/dL
MICROALB/CREAT RATIO: 12 ug/mg{creat} (ref <30)

## 2016-07-31 LAB — INSULIN, RANDOM: Insulin: 5.7 u[IU]/mL (ref 2.0–19.6)

## 2016-07-31 NOTE — Addendum Note (Signed)
Addended by: Lianne Cure A on: 07/31/2016 04:06 PM   Modules accepted: Orders

## 2016-08-08 ENCOUNTER — Other Ambulatory Visit: Payer: Self-pay | Admitting: Internal Medicine

## 2016-08-08 MED ORDER — ALPRAZOLAM 1 MG PO TABS: ORAL_TABLET | ORAL | 3 refills | Status: AC

## 2016-11-07 ENCOUNTER — Ambulatory Visit: Payer: Self-pay | Admitting: Internal Medicine

## 2016-11-14 ENCOUNTER — Encounter: Payer: Self-pay | Admitting: Vascular Surgery

## 2016-11-20 ENCOUNTER — Encounter: Payer: Self-pay | Admitting: Vascular Surgery

## 2016-11-20 ENCOUNTER — Ambulatory Visit (HOSPITAL_COMMUNITY)
Admission: RE | Admit: 2016-11-20 | Discharge: 2016-11-20 | Disposition: A | Discharge status: Home / Self Care | Payer: MEDICARE | Source: Ambulatory Visit | Attending: Vascular Surgery | Admitting: Vascular Surgery

## 2016-11-20 ENCOUNTER — Ambulatory Visit (INDEPENDENT_AMBULATORY_CARE_PROVIDER_SITE_OTHER): Payer: MEDICARE | Admitting: Vascular Surgery

## 2016-11-20 VITALS — BP 115/78 | HR 66 | Temp 97.0°F | Resp 20 | Ht 72.0 in | Wt 224.0 lb

## 2016-11-20 DIAGNOSIS — I714 Abdominal aortic aneurysm, without rupture, unspecified: Secondary | ICD-10-CM

## 2016-11-20 NOTE — Progress Notes (Signed)
[facility] [place]  Patient name: Walter Snyder MRN: [id number] DOB: 22-Nov-1940 Sex: male  REASON FOR VISIT: Follow-up of asymptomatic abdominal aortic aneurysm  HPI: [name] is a 76 y.o. male here today for follow-up. He is here today with his wife. They report that he recently was seen by his cardiologist at [facility] told that he has no evidence of congestive heart failure and echocardiogram was somewhat improved. He ports no lower extremity swelling. Does have no symptoms related to his aneurysm has have some shortness of breath with exertion. Does have some when necessary use of oxygen at home.  Past Medical History:  Diagnosis Date  . AAA (abdominal aortic aneurysm) ([place])   . Aneurysm ([place])   . Arthritis   . Asthma   . BPH (benign prostatic hyperplasia)   . Bronchitis   . 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 CHF (congestive heart failure) ([place])   . COPD (chronic obstructive pulmonary disease) ([place])   . DDD (degenerative disc disease)   . Depression   . Depression with anxiety   . Edema    bilateral lower extremities  . GERD (gastroesophageal reflux disease)   . Hiatal hernia   . Hyperlipidemia   . Hypertension   . Prediabetes     Family History  Problem Relation Age of Onset  . Other Sister        AAA  . Heart disease Mother   . Diabetes Mother   . Heart attack Mother   . Heart disease Father   . Cancer Brother   . Diabetes Brother   . Heart disease Brother        before age 35  . Varicose Veins Brother   . Heart attack Brother   . Heart disease Son        before age 68    SOCIAL HISTORY: Social History  Substance Use Topics  . Smoking status: Former Smoker    Types: Cigarettes    Quit date: 06/11/1992  . Smokeless tobacco: Never Used  . Alcohol use No    No Known Allergies  Current Outpatient Prescriptions  Medication Sig Dispense Refill  . apixaban (ELIQUIS) 5 MG  TABS tablet Take by mouth.    Marland Kitchen atorvastatin (LIPITOR) 80 MG tablet Take 1 daily titrate as directed for cholesterol 90 tablet 1  . Cholecalciferol (VITAMIN D3) 5000 UNITS CAPS Take 2 caps (10,000) units daily    . citalopram (CELEXA) 40 MG tablet Take 1 tablet (40 mg total) by mouth daily. FOR Mood 90 tablet 3  . clopidogrel (PLAVIX) 75 MG tablet     . doxazosin (CARDURA) 8 MG tablet TAKE 1 TABLET BY MOUTH  DAILY FOR PROSTATE AND FOR  BLOOD PRESSURE 90 tablet 2  . ezetimibe (ZETIA) 10 MG tablet Take by mouth.    . fluticasone (FLONASE) 50 MCG/ACT nasal spray Place 1 spray into both nostrils daily. 48 g 3  . furosemide (LASIX) 20 MG tablet Take 20 mg by mouth daily.    Marland Kitchen ipratropium (ATROVENT) 0.03 % nasal spray Use 1 to 2 nasal sprays 3 to 4 x /day for nasal drainage 90 mL 1  . Multiple Vitamin (MULTIVITAMIN) capsule Take 1 capsule by mouth daily.      Marland Kitchen oxybutynin (DITROPAN) 5 MG tablet Take 1 tablet 3 x day as needed for Overactive Bladder 270 tablet 3  . predniSONE (DELTASONE) 20 MG tablet 1 tab 3  x day for 3 days, then 1 tab 2 x day for 3 days, then 1 tab 1 x day for 5 days 20 tablet 1  . spironolactone (ALDACTONE) 25 MG tablet Take 25 mg by mouth daily. Takes 1/2  (25 mg tablet) tablet daily.    . tamsulosin (FLOMAX) 0.4 MG CAPS capsule TAKE 1 CAPSULE BY MOUTH  EVERY MORNING FOR PROSTATE 90 capsule 1  . testosterone cypionate (DEPO-TESTOSTERONE) 200 MG/ML injection Inject 2 cc intra muscular every 2 weeks 10 mL 0  . tiotropium (SPIRIVA) 18 MCG inhalation capsule Place into inhaler and inhale.    . Fluticasone-Salmeterol (ADVAIR DISKUS) 250-50 MCG/DOSE AEPB Inhale 1 puff into the lungs once. One inhalation 9 am 60 each 11   No current facility-administered medications for this visit.     REVIEW OF SYSTEMS:  [X]  denotes positive finding, [ ]  denotes negative finding Cardiac  Comments:  Chest pain or chest pressure:    Shortness of breath upon exertion: x   Short of breath when lying  flat:    Irregular heart rhythm:        Vascular    Pain in calf, thigh, or hip brought on by ambulation:    Pain in feet at night that wakes you up from your sleep:     Blood clot in your veins:    Leg swelling:           PHYSICAL EXAM: Vitals:   11/20/16 1101  BP: 115/78  Pulse: 66  Resp: 20  Temp: 97 F (36.1 C)  TempSrc: Oral  SpO2: 93%  Weight: 224 lb (101.6 kg)  Height: 6' (1.829 m)    GENERAL: The patient is a well-nourished male, in no acute distress. The vital signs are documented above. CARDIOVASCULAR: Obese. I do not palpate an aneurysm. Palpable femoral and popliteal pulses with no evidence of peripheral aneurysm PULMONARY: There is good air exchange  MUSCULOSKELETAL: There are no major deformities or cyanosis. NEUROLOGIC: No focal weakness or paresthesias are detected. SKIN: There are no ulcers or rashes noted. PSYCHIATRIC: The patient has a normal affect.  DATA:  Ultrasound today reveals maximal diameter of 5.1 cm as compared to 5.3 cm with this most recent study. He does have some overlying bowel gas.  MEDICAL ISSUES: No evidence of increased abdominal aortic aneurysm size. He does have known diffuse arteriomegaly with his prior CT angiogram. Would recommend continued six-month surveillance. If he shows enlargement with repeat CT scan at that time.    Rosetta Posner, MD FACS [facility] Tel [phone] Pager [phone]

## 2016-11-30 NOTE — Addendum Note (Signed)
Addended by: Lianne Cure A on: 11/30/2016 02:26 PM   Modules accepted: Orders

## 2016-12-06 ENCOUNTER — Other Ambulatory Visit: Payer: Self-pay

## 2016-12-06 ENCOUNTER — Other Ambulatory Visit: Payer: Self-pay | Admitting: Internal Medicine

## 2016-12-06 DIAGNOSIS — N320 Bladder-neck obstruction: Secondary | ICD-10-CM

## 2016-12-06 MED ORDER — DOXAZOSIN MESYLATE 8 MG PO TABS: ORAL_TABLET | ORAL | 1 refills | Status: DC

## 2016-12-11 ENCOUNTER — Other Ambulatory Visit: Payer: Self-pay | Admitting: *Deleted

## 2016-12-11 MED ORDER — TESTOSTERONE CYPIONATE 200 MG/ML IM SOLN: INTRAMUSCULAR | 0 refills | Status: AC

## 2017-01-04 ENCOUNTER — Other Ambulatory Visit: Payer: Self-pay | Admitting: Physician Assistant

## 2017-01-04 ENCOUNTER — Other Ambulatory Visit: Payer: Self-pay

## 2017-01-04 DIAGNOSIS — E782 Mixed hyperlipidemia: Secondary | ICD-10-CM

## 2017-01-04 MED ORDER — EZETIMIBE 10 MG PO TABS: 10.0000 mg | ORAL_TABLET | Freq: Every day | ORAL | 0 refills | Status: AC

## 2017-01-07 ENCOUNTER — Other Ambulatory Visit: Payer: Self-pay | Admitting: Physician Assistant

## 2017-02-01 ENCOUNTER — Encounter: Payer: Self-pay | Admitting: Internal Medicine

## 2017-02-08 ENCOUNTER — Ambulatory Visit: Payer: Self-pay | Admitting: Internal Medicine

## 2017-02-08 IMAGING — DX DG CHEST 2V
3 series · 3 of 3 positions shown · non-contrast
Comparison: 08/28/2012

CLINICAL DATA: Fall 2 weeks ago. Anterior chest pain. Abnormal
breath sounds on left. Dyspnea. COPD.

EXAM:
CHEST  2 VIEW

[chest pa]
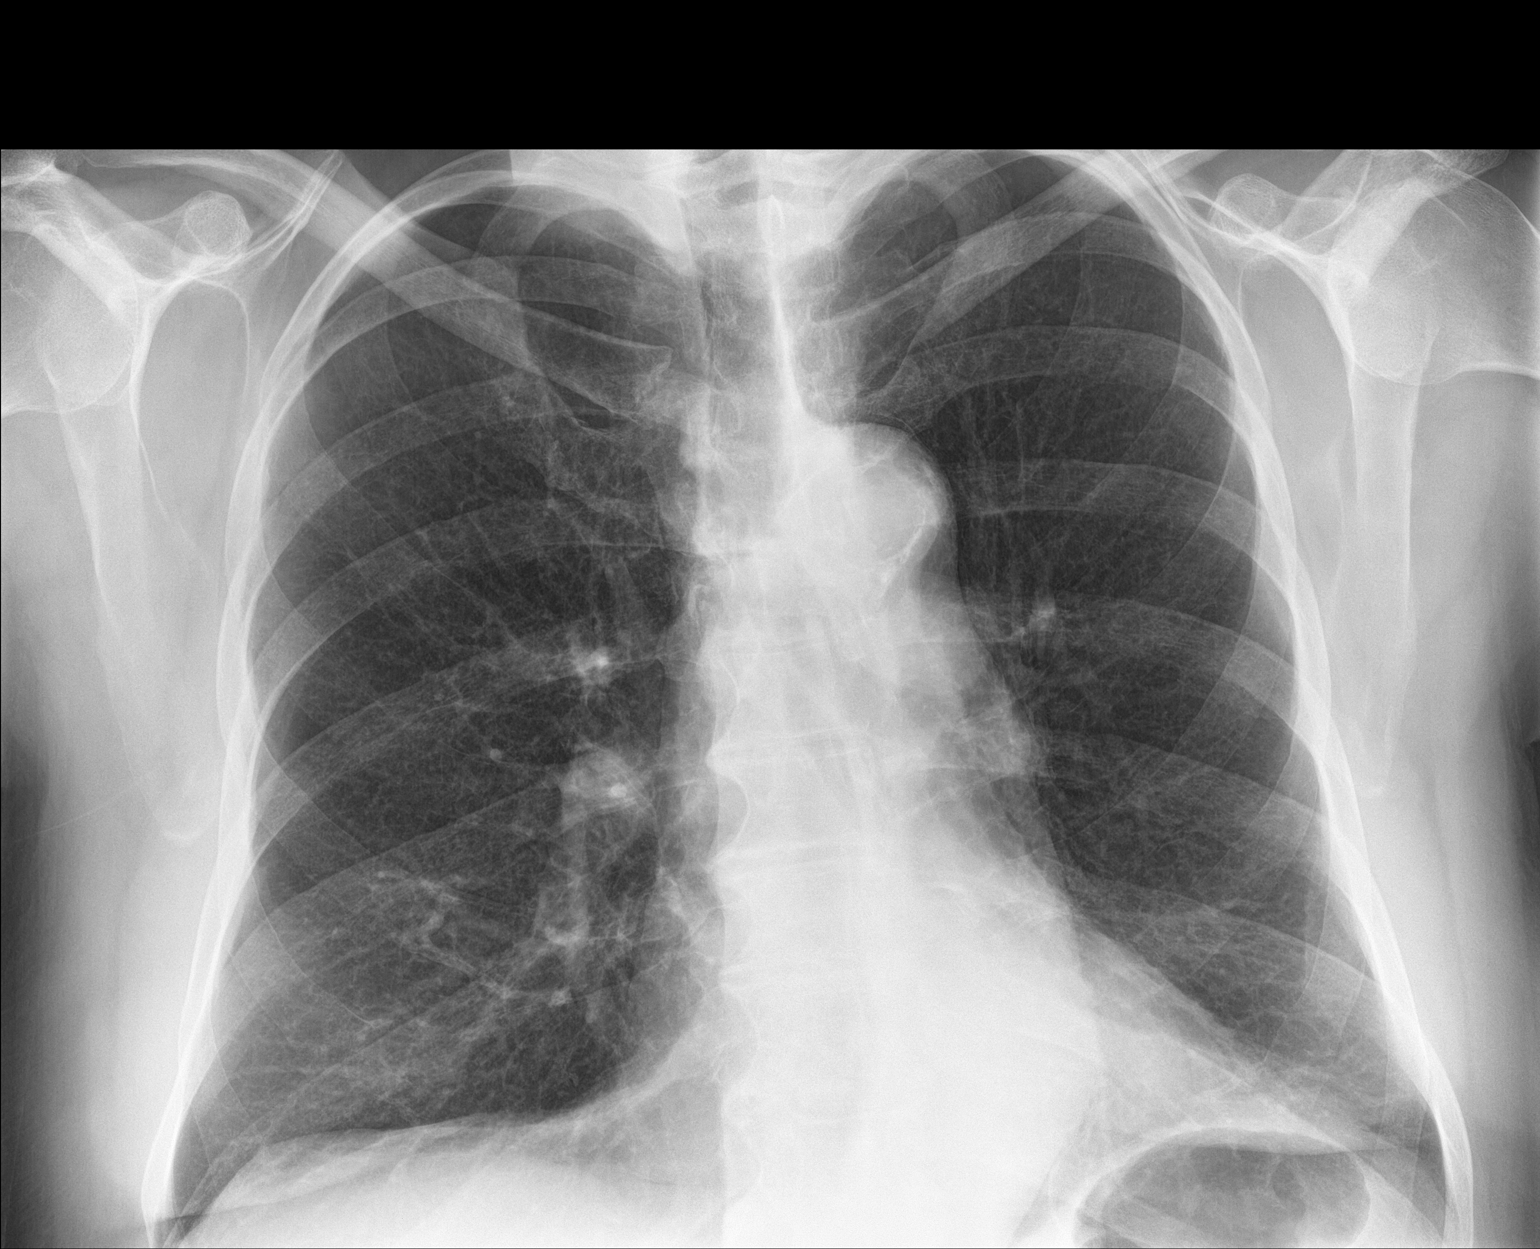

[chest lat (1 of 2)]
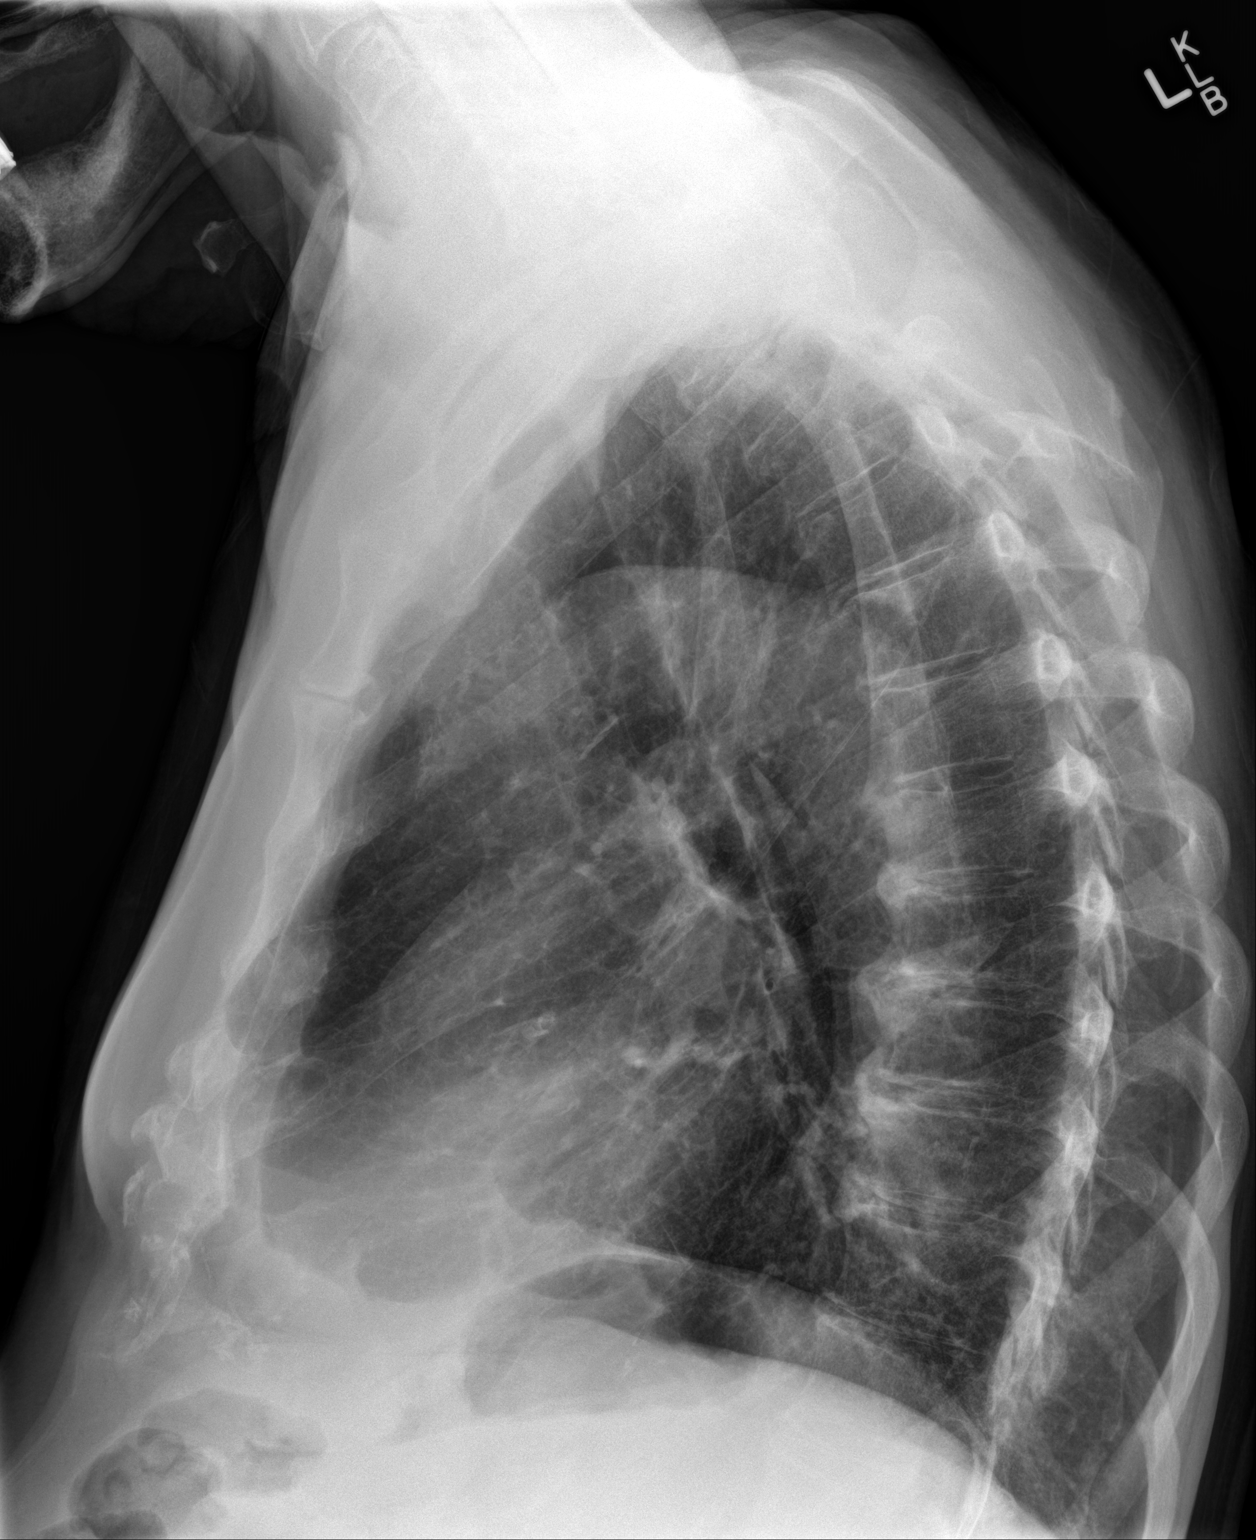

[chest lat (2 of 2)]
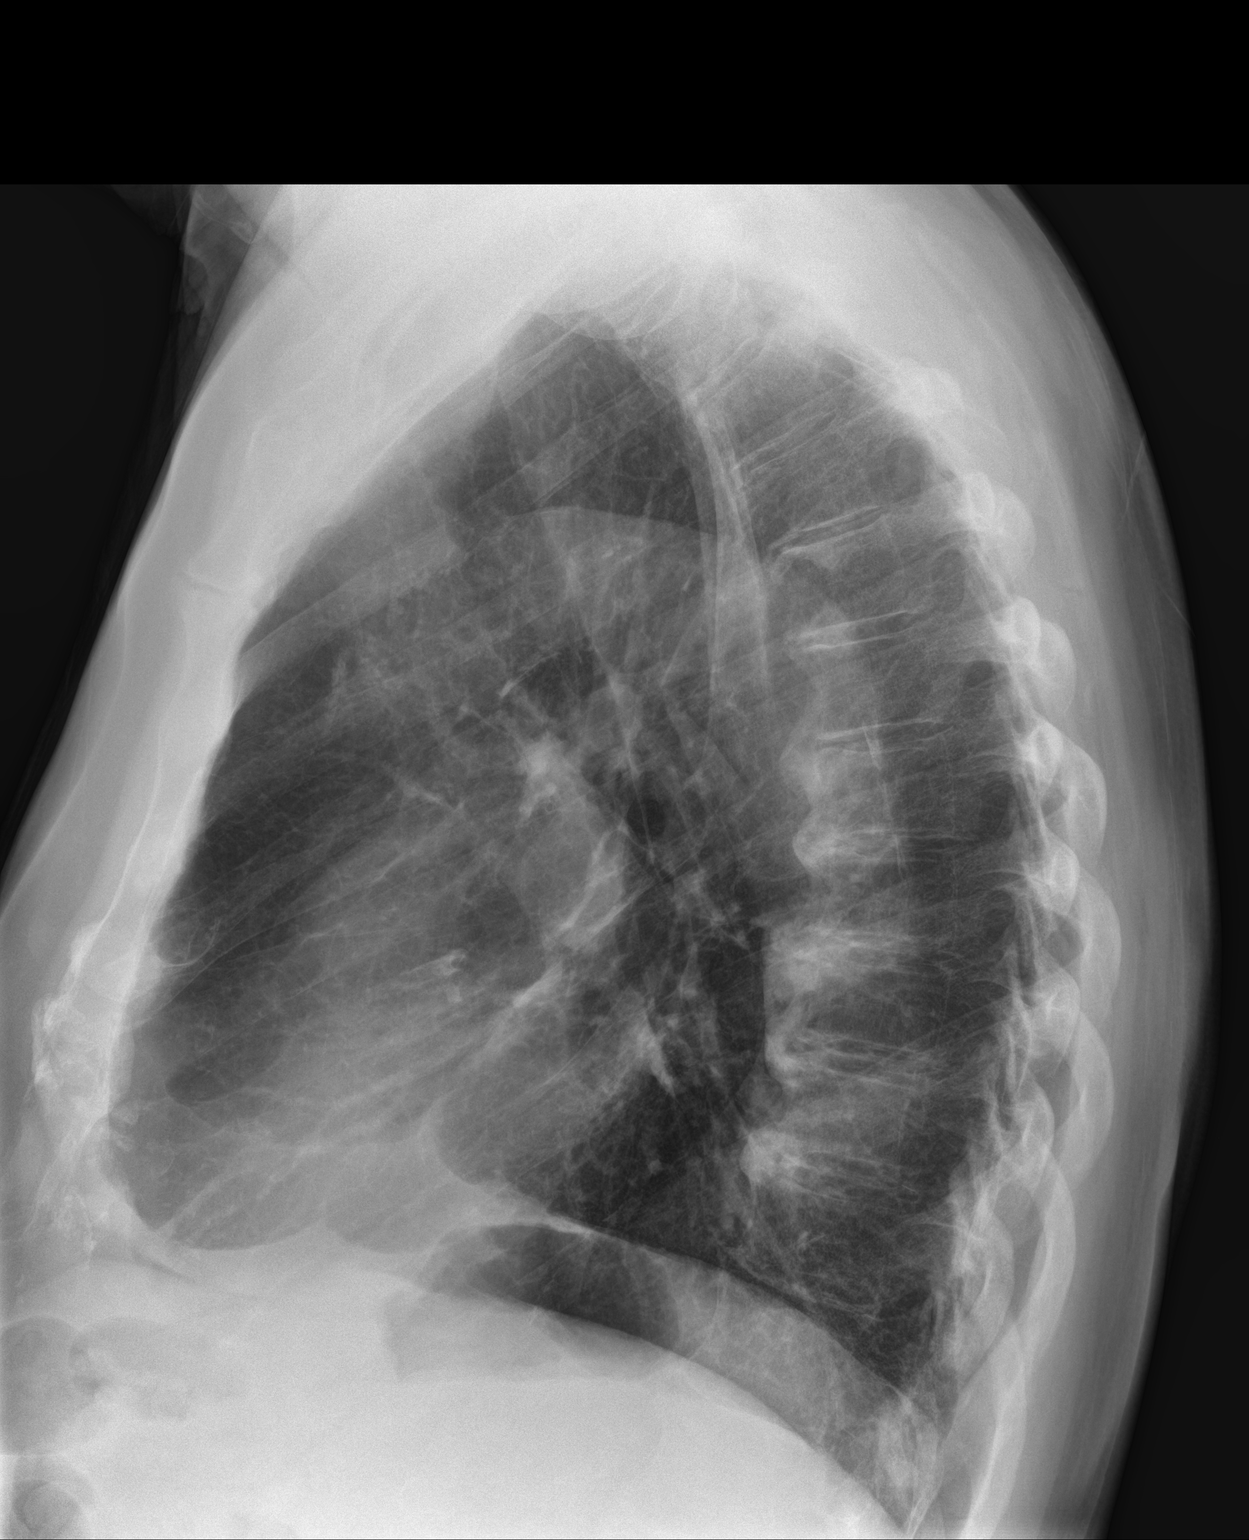

[3 of 3 positions shown; findings below may reference images not displayed]

FINDINGS: The heart size and mediastinal contours are within normal limits.
Pulmonary hyperinflation again seen, consistent with COPD. Both
lungs are clear. No evidence pleural effusion. No mass or
lymphadenopathy identified. Thoracic spine degenerative changes
again noted.
IMPRESSION: COPD.  No active disease.

## 2017-02-22 ENCOUNTER — Other Ambulatory Visit: Payer: Self-pay | Admitting: Internal Medicine

## 2017-02-22 DIAGNOSIS — N320 Bladder-neck obstruction: Secondary | ICD-10-CM

## 2017-05-10 ENCOUNTER — Other Ambulatory Visit: Payer: Self-pay | Admitting: *Deleted

## 2017-05-27 ENCOUNTER — Ambulatory Visit (HOSPITAL_COMMUNITY)
Admission: RE | Admit: 2017-05-27 | Discharge: 2017-05-27 | Disposition: A | Discharge status: Home / Self Care | Payer: MEDICARE | Source: Ambulatory Visit | Attending: Family | Admitting: Family

## 2017-05-27 ENCOUNTER — Encounter: Payer: Self-pay | Admitting: Family

## 2017-05-27 ENCOUNTER — Ambulatory Visit (INDEPENDENT_AMBULATORY_CARE_PROVIDER_SITE_OTHER): Payer: MEDICARE | Admitting: Family

## 2017-05-27 VITALS — BP 180/110 | HR 90 | Temp 97.4°F | Resp 18 | Wt 229.8 lb

## 2017-05-27 DIAGNOSIS — I714 Abdominal aortic aneurysm, without rupture, unspecified: Secondary | ICD-10-CM

## 2017-05-27 DIAGNOSIS — Z87891 Personal history of nicotine dependence: Secondary | ICD-10-CM | POA: Diagnosis not present

## 2017-05-27 NOTE — Patient Instructions (Addendum)
Before your next abdominal ultrasound:  Take two Extra-Strength Gas-X capsules at bedtime the night before the test. Take another two Extra-Strength Gas-X capsules 3 hours before the test.  Avoid gas forming foods the day before the test.       Abdominal Aortic Aneurysm Blood pumps away from the heart through tubes (blood vessels) called arteries. Aneurysms are weak or damaged places in the wall of an artery. It bulges out like a balloon. An abdominal aortic aneurysm happens in the main artery of the body (aorta). It can burst or tear, causing bleeding inside the body. This is an emergency. It needs treatment right away. What are the causes? The exact cause is unknown. Things that could cause this problem include:  Fat and other substances building up in the lining of a tube.  Swelling of the walls of a blood vessel.  Certain tissue diseases.  Belly (abdominal) trauma.  An infection in the main artery of the body.  What increases the risk? There are things that make it more likely for you to have an aneurysm. These include:  Being over the age of 77 years old.  Having high blood pressure (hypertension).  Being a male.  Being white.  Being very overweight (obese).  Having a family history of aneurysm.  Using tobacco products.  What are the signs or symptoms? Symptoms depend on the size of the aneurysm and how fast it grows. There may not be symptoms. If symptoms occur, they can include:  Pain (belly, side, lower back, or groin).  Feeling full after eating a small amount of food.  Feeling sick to your stomach (nauseous), throwing up (vomiting), or both.  Feeling a lump in your belly that feels like it is beating (pulsating).  Feeling like you will pass out (faint).  How is this treated?  Medicine to control blood pressure and pain.  Imaging tests to see if the aneurysm gets bigger.  Surgery. How is this prevented? To lessen your chance of getting this  condition:  Stop smoking. Stop chewing tobacco.  Limit or avoid alcohol.  Keep your blood pressure, blood sugar, and cholesterol within normal limits.  Eat less salt.  Eat foods low in saturated fats and cholesterol. These are found in animal and whole dairy products.  Eat more fiber. Fiber is found in whole grains, vegetables, and fruits.  Keep a healthy weight.  Stay active and exercise often.  This information is not intended to replace advice given to you by your health care provider. Make sure you discuss any questions you have with your health care provider. Document Released: 09/22/2012 Document Revised: 11/03/2015 Document Reviewed: 06/27/2012 Elsevier Interactive Patient Education  2017 [employer]  

## 2017-05-27 NOTE — Progress Notes (Signed)
[facility] [place]   CC: Follow up Abdominal Aortic Aneurysm  History of Present Illness  Walter Snyder is a 76 y.o. (July 02, 1940) male who returns today for continued follow-up of his abdominal aortic aneurysm. He reports that he has been diagnosed with congestive heart failure. He had some improvement in his breathing with treatment. Reports that he had more swelling in his legs which is resolved as well. He has no symptoms regarding his aneurysm.  Dr. Donnetta Hutching last evaluated pt on 03/08/15. At that time Dr. Donnetta Hutching explained again that pt does have diffuse arteriomegaly. His non- aneurysmal aorta at the level of the diaphragm in the renal arteries is 4 cm. Explained that the 5.6 cm aneurysmal dilatation should be less risky with his generalized arteriomegaly then was of normal-size aorta. Dr. Donnetta Hutching did not feel that pt has any benefit for treatment versus observation at this time. This is particularly in light of his exacerbation of congestive heart failure.   Pt returns today for 6 months follow up of his AAA.  The patient denies claudication in legs with walking. The patient denies history of stroke or TIA symptoms.  Pt states his c-spine and legs were checked in 2017 for drawing sensation in his neck and legs; states he was told he has degenerative back disease, spurs on his spine. He reports worsening low back pain that is alleviated by sitting. He denies abdominal pain.   Pt states that he had a CT angiogram on 07-01-13 and Dr. Donnetta Hutching told him he is not a candidate for an EVAR.  Pt reports he had 3 cardiac stents placed in December 2016; states he was placed on Plavix for a year and also 81 mg ASA which have both been discontinued.   According to the progress note dated 05-09-16, [facility] is managing pt's heart failure, CAD, atrial fib, and hypertension.   Pt did not take his morning medications, blood pressure medication included, as he was NPO for  abdominal duplex.    Pt Diabetic: No Pt smoker: former smoker, quit in 1984     Past Medical History:  Diagnosis Date  . AAA (abdominal aortic aneurysm) ([place])   . Aneurysm ([place])   . Arthritis   . Asthma   . BPH (benign prostatic hyperplasia)   . Bronchitis   . Cancer ([place]) Dec. 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 CHF (congestive heart failure) ([place])   . COPD (chronic obstructive pulmonary disease) ([place])   . DDD (degenerative disc disease)   . Depression   . Depression with anxiety   . Edema    bilateral lower extremities  . GERD (gastroesophageal reflux disease)   . Hiatal hernia   . Hyperlipidemia   . Hypertension   . Prediabetes    Past Surgical History:  Procedure Laterality Date  . [place]  Dec. 12, 2014   BCC- Back   ( Basal cell carcinoma )  . CORONARY STENT PLACEMENT    . CYST EXCISION     back  . KNEE ARTHROSCOPY     left  . lens implant     bilateral   Social History Social History   Socioeconomic History  . Marital status: Married    Spouse name: Not on file  . Number of children: Not on file  . Years of education: Not on file  . Highest education level: Not on file  Social Needs  . Financial resource strain: Not on file  . Food insecurity -  worry: Not on file  . Food insecurity - inability: Not on file  . Transportation needs - medical: Not on file  . Transportation needs - non-medical: Not on file  Occupational History  . Not on file  Tobacco Use  . Smoking status: Former Smoker    Types: Cigarettes    Last attempt to quit: 06/11/1992    Years since quitting: 24.9  . Smokeless tobacco: Never Used  Substance and Sexual Activity  . Alcohol use: No  . Drug use: No  . Sexual activity: Not on file  Other Topics Concern  . Not on file  Social History Narrative  . Not on file   Family History Family History  Problem Relation Age of Onset  . Other Sister        AAA  . Heart disease Mother   . Diabetes Mother   . Heart attack Mother   . Heart  disease Father   . Cancer Brother   . Diabetes Brother   . Heart disease Brother        before age 26  . Varicose Veins Brother   . Heart attack Brother   . Heart disease Son        before age 71    Current Outpatient Medications on File Prior to Visit  Medication Sig Dispense Refill  . apixaban (ELIQUIS) 5 MG TABS tablet Take by mouth.    Marland Kitchen atorvastatin (LIPITOR) 80 MG tablet Take 1 daily titrate as directed for cholesterol 90 tablet 1  . Cholecalciferol (VITAMIN D3) 5000 UNITS CAPS Take 2 caps (10,000) units daily    . citalopram (CELEXA) 40 MG tablet Take 1 tablet (40 mg total) by mouth daily. FOR Mood 90 tablet 3  . clopidogrel (PLAVIX) 75 MG tablet     . doxazosin (CARDURA) 8 MG tablet TAKE 1 TABLET BY MOUTH  DAILY FOR PROSTATE AND FOR  BLOOD PRESSURE 90 tablet 1  . ezetimibe (ZETIA) 10 MG tablet Take 1 tablet (10 mg total) by mouth daily. 90 tablet 0  . fluticasone (FLONASE) 50 MCG/ACT nasal spray Place 1 spray into both nostrils daily. 48 g 3  . furosemide (LASIX) 20 MG tablet Take 20 mg by mouth daily.    . Multiple Vitamin (MULTIVITAMIN) capsule Take 1 capsule by mouth daily.      Marland Kitchen oxybutynin (DITROPAN) 5 MG tablet Take 1 tablet 3 x day as needed for Overactive Bladder 270 tablet 3  . predniSONE (DELTASONE) 20 MG tablet 1 tab 3 x day for 3 days, then 1 tab 2 x day for 3 days, then 1 tab 1 x day for 5 days 20 tablet 1  . spironolactone (ALDACTONE) 25 MG tablet Take 25 mg by mouth daily. Takes 1/2  (25 mg tablet) tablet daily.    . tamsulosin (FLOMAX) 0.4 MG CAPS capsule TAKE 1 CAPSULE BY MOUTH  EVERY MORNING FOR PROSTATE 90 capsule 0  . testosterone cypionate (DEPO-TESTOSTERONE) 200 MG/ML injection Inject 2 cc intra muscular every 2 weeks 10 mL 0  . tiotropium (SPIRIVA) 18 MCG inhalation capsule Place into inhaler and inhale.    . Fluticasone-Salmeterol (ADVAIR DISKUS) 250-50 MCG/DOSE AEPB Inhale 1 puff into the lungs once. One inhalation 9 am 60 each 11  . ipratropium  (ATROVENT) 0.03 % nasal spray Use 1 to 2 nasal sprays 3 to 4 x /day for nasal drainage 90 mL 1  . [DISCONTINUED] fluticasone (VERAMYST) 27.5 MCG/SPRAY nasal spray Place 2 sprays into the nose daily.  No current facility-administered medications on file prior to visit.    No Known Allergies  ROS: See HPI for pertinent positives and negatives.  Physical Examination  Vitals:   05/27/17 1042 05/27/17 1044 05/27/17 1139 05/27/17 1140  BP: (!) 176/119 (!) 184/119 (!) 180/100 (!) 180/110  Pulse: 90     Resp: 18     Temp: (!) 97.4 F (36.3 C)     TempSrc: Oral     SpO2: 95%     Weight: 229 lb 12.8 oz (104.2 kg)      Body mass index is 31.17 kg/m.  General: A&O x 3, WD, obese male, central adiposity.  Pulmonary: Sym exp, + pursed lips expirations with getting off exam table and getting dressed, + labored respirations with the same activity, limited air movt throughout, no adventitious sounds  Cardiac: Regular rate and rhythm with occasional to frequent premature contractions   Carotid Bruits Left Right   Negative Negative   Abdominal aortic pulse is not palpable Radial pulses are 2+ and equal.   VASCULAR EXAM:     LE Pulses LEFT RIGHT   FEMORAL 1+ palpable, obese 1+ palpable, obese    POPLITEAL not palpable  not palpable   POSTERIOR TIBIAL not palpable  not palpable    DORSALIS PEDIS  ANTERIOR TIBIAL 2+palpable  2+palpable     Gastrointestinal: soft, NTND, -G/R, - HSM, asymptomatic reducible ventral hernia, - CVAT B.  Musculoskeletal: M/S 5/5 throughout, Extremities without ischemic changes.   Skin: No rash, no cellulitis, no ulcers noted.   Neurologic: CN 2-12 intact, Pain and light touch intact in extremities, Motor exam as listed above      DATA  AAA Duplex (05/27/2017):  Previous size: 5.36 cm (Date: 11-20-16)  Current size:  5.4 cm (Date: 05-27-17); Right CIA: 1.5 cm; Left CIA: not visualized  Decreased visualization of the abdominal vasculature due to overlying bowel gas.  No hemodynamically significant stenosis of the abdominal aorta and bilateral proximal common iliac arteries.   No significant change compared to CTA on 07-01-13.   CTA abd/pelvis (07-01-13): There is stable aneurysmal disease of the abdominal aorta. Beginning at the diaphragmatic hiatus, the lower descending thoracic aorta measures 4 cm in diameter. Juxtarenal component of abdominal aortic aneurysm measures 4.5 x 4.8 cm. Infrarenal component measures 4.4 x 5.4 cm. There is no evidence rupture or dissection. Amount of internal mural thrombus is stable.  The celiac axis, superior mesenteric artery, bilateral single renal arteries and inferior mesenteric artery shows stable patency. No progressive disease is seen involving bilateral iliac or femoral Arteries.    Medical Decision Making  The patient is a 76 y.o. male who presents with asymptomatic AAA with no increase in size, largest diameter today at 5.4 cm, same as CTA in 2015.  Pt has diffuse arteriomegaly. His non- aneurysmal aorta at the level of the diaphragm in the renal arteries is 4 cm. The 5.6 cm aneurysmal dilatation should be less risky with his generalized arteriomegaly then was of normal-size aorta. Dr. Donnetta Hutching did not feel that pt has any benefit for treatment versus observation at this time. This is particularly in light of his exacerbation of congestive heart failure.   I advised pt to call his cardiologist re his uncontrolled blood pressure. 180/100 and 180/110 were manual checks in both arms. He has not yet taken his morning medications. He does not check his blood pressure at home.  He has a regularcardiac rhythm with occasional to frequent premature contractions on  auscultation today. He states he was cardioverted out of atrial fib, but feels like his cardiac rhythm is irregular at times.    Based on this patient's exam and diagnostic studies, the patient will follow up in 6 months  with the following studies: AAA duplex.  Consideration for repair of AAA would be made when the size is 5.6 cm, growth > 1 cm/yr, and symptomatic status.               The patient was given information about AAA including signs, symptoms, treatment, and how to minimize the risk of enlargement and rupture of aneurysms.    I emphasized the importance of maximal medical management including strict control of blood pressure, blood glucose, and lipid levels, antiplatelet agents, obtaining regular exercise, and continued cessation of smoking.   The patient was advised to call 911 should the patient experience sudden onset abdominal or back pain.   Thank you for allowing [place] to participate in this patient's care.  Clemon Chambers, RN, MSN, FNP-C [facility] [place] Office: [phone]  Clinic Physician: Trula Slade  05/27/2017, 12:54 PM

## 2017-06-12 NOTE — Addendum Note (Signed)
Addended by: Lianne Cure A on: 06/12/2017 04:16 PM   Modules accepted: Orders

## 2017-08-23 ENCOUNTER — Encounter: Payer: Self-pay | Admitting: Internal Medicine

## 2017-10-22 ENCOUNTER — Other Ambulatory Visit: Payer: Self-pay | Admitting: Internal Medicine

## 2017-10-22 DIAGNOSIS — N320 Bladder-neck obstruction: Secondary | ICD-10-CM

## 2017-12-03 ENCOUNTER — Inpatient Hospital Stay (HOSPITAL_COMMUNITY): Admission: RE | Admit: 2017-12-03 | Payer: Self-pay | Source: Ambulatory Visit

## 2017-12-03 ENCOUNTER — Ambulatory Visit: Payer: MEDICARE | Admitting: Vascular Surgery

## 2018-01-07 ENCOUNTER — Encounter: Payer: Self-pay | Admitting: Vascular Surgery

## 2018-01-07 ENCOUNTER — Other Ambulatory Visit: Payer: Self-pay

## 2018-01-07 ENCOUNTER — Ambulatory Visit (INDEPENDENT_AMBULATORY_CARE_PROVIDER_SITE_OTHER): Payer: MEDICARE | Admitting: Vascular Surgery

## 2018-01-07 ENCOUNTER — Ambulatory Visit (HOSPITAL_COMMUNITY)
Admission: RE | Admit: 2018-01-07 | Discharge: 2018-01-07 | Disposition: A | Discharge status: Home / Self Care | Payer: MEDICARE | Source: Ambulatory Visit | Attending: Family | Admitting: Family

## 2018-01-07 VITALS — BP 97/68 | HR 72 | Temp 96.8°F | Resp 16 | Ht 73.0 in | Wt 220.0 lb

## 2018-01-07 DIAGNOSIS — Z87891 Personal history of nicotine dependence: Secondary | ICD-10-CM

## 2018-01-07 DIAGNOSIS — I714 Abdominal aortic aneurysm, without rupture, unspecified: Secondary | ICD-10-CM

## 2018-01-07 NOTE — Progress Notes (Signed)
[facility]ist of [facility]  Patient name: Walter Snyder MRN: [id number] DOB: 1941/01/04 Sex: male  REASON FOR VISIT: Follow-up abdominal aortic aneurysm  HPI: Walter Snyder is a 77 y.o. male here today for follow-up.  Has a very long history of known abdominal aortic aneurysm with follow-up for greater than 25 years.  He continues to have difficulty with shortness of breath with exertion and lower extremity edema which is progressive throughout the day.  He has no symptoms referable to his aneurysm.  Past Medical History:  Diagnosis Date  . AAA (abdominal aortic aneurysm) ([place])   . Aneurysm ([place])   . Arthritis   . Asthma   . BPH (benign prostatic hyperplasia)   . Bronchitis   . Cancer ([place]) Dec. 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 CHF (congestive heart failure) ([place])   . COPD (chronic obstructive pulmonary disease) ([place])   . DDD (degenerative disc disease)   . Depression   . Depression with anxiety   . Edema    bilateral lower extremities  . GERD (gastroesophageal reflux disease)   . Hiatal hernia   . Hyperlipidemia   . Hypertension   . Prediabetes     Family History  Problem Relation Age of Onset  . Other Sister        AAA  . Heart disease Mother   . Diabetes Mother   . Heart attack Mother   . Heart disease Father   . Cancer Brother   . Diabetes Brother   . Heart disease Brother        before age 13  . Varicose Veins Brother   . Heart attack Brother   . Heart disease Son        before age 50    SOCIAL HISTORY: Social History   Tobacco Use  . Smoking status: Former Smoker    Types: Cigarettes    Last attempt to quit: 06/11/1992    Years since quitting: 25.5  . Smokeless tobacco: Never Used  Substance Use Topics  . Alcohol use: No    Allergies  Allergen Reactions  . Atorvastatin Other (See Comments)  . Pollen Extract Itching    Current Outpatient Medications  Medication Sig Dispense Refill  . apixaban  (ELIQUIS) 5 MG TABS tablet Take by mouth.    Marland Kitchen atorvastatin (LIPITOR) 80 MG tablet Take 1 daily titrate as directed for cholesterol 90 tablet 1  . Cholecalciferol (VITAMIN D3) 5000 UNITS CAPS Take 2 caps (10,000) units daily    . citalopram (CELEXA) 40 MG tablet Take 1 tablet (40 mg total) by mouth daily. FOR Mood 90 tablet 3  . clopidogrel (PLAVIX) 75 MG tablet     . doxazosin (CARDURA) 8 MG tablet TAKE 1 TABLET BY MOUTH  DAILY FOR PROSTATE AND FOR  BLOOD PRESSURE 90 tablet 1  . ezetimibe (ZETIA) 10 MG tablet Take 1 tablet (10 mg total) by mouth daily. 90 tablet 0  . fluticasone (FLONASE) 50 MCG/ACT nasal spray Place 1 spray into both nostrils daily. 48 g 3  . furosemide (LASIX) 20 MG tablet Take 20 mg by mouth daily.    . Multiple Vitamin (MULTIVITAMIN) capsule Take 1 capsule by mouth daily.      Marland Kitchen oxybutynin (DITROPAN) 5 MG tablet Take 1 tablet 3 x day as needed for Overactive Bladder 270 tablet 3  . spironolactone (ALDACTONE) 25 MG tablet Take 25 mg by mouth daily. Takes 1/2  (25 mg tablet) tablet daily.    Marland Kitchen  tamsulosin (FLOMAX) 0.4 MG CAPS capsule TAKE 1 CAPSULE BY MOUTH  EVERY MORNING FOR PROSTATE 90 capsule 0  . testosterone cypionate (DEPO-TESTOSTERONE) 200 MG/ML injection Inject 2 cc intra muscular every 2 weeks 10 mL 0  . tiotropium (SPIRIVA) 18 MCG inhalation capsule Place into inhaler and inhale.    . Fluticasone-Salmeterol (ADVAIR DISKUS) 250-50 MCG/DOSE AEPB Inhale 1 puff into the lungs once. One inhalation 9 am 60 each 11  . ipratropium (ATROVENT) 0.03 % nasal spray Use 1 to 2 nasal sprays 3 to 4 x /day for nasal drainage 90 mL 1  . predniSONE (DELTASONE) 20 MG tablet 1 tab 3 x day for 3 days, then 1 tab 2 x day for 3 days, then 1 tab 1 x day for 5 days (Patient not taking: Reported on 01/07/2018) 20 tablet 1   No current facility-administered medications for this visit.     REVIEW OF SYSTEMS:  [X]  denotes positive finding, [ ]  denotes negative finding Cardiac  Comments:  Chest  pain or chest pressure:    Shortness of breath upon exertion: x   Short of breath when lying flat: x   Irregular heart rhythm:        Vascular    Pain in calf, thigh, or hip brought on by ambulation:    Pain in feet at night that wakes you up from your sleep:     Blood clot in your veins:    Leg swelling:  x         PHYSICAL EXAM: Vitals:   01/07/18 1144  BP: 97/68  Pulse: 72  Resp: 16  Temp: (!) 96.8 F (36 C)  TempSrc: Oral  SpO2: 93%  Weight: 220 lb (99.8 kg)  Height: 6\' 1"  (1.854 m)    GENERAL: The patient is a well-nourished male, in no acute distress. The vital signs are documented above. CARDIOVASCULAR: 2+ radial and 2+ dorsalis pedis pulses bilaterally.  No palpable abdominal aortic aneurysm. PULMONARY: There is good air exchange  MUSCULOSKELETAL: There are no major deformities or cyanosis.  Does have a large ventral incisional hernia versus diastases rectus  nEUROLOGIC: No focal weakness or paresthesias are detected. SKIN: There are no ulcers or rashes noted. PSYCHIATRIC: The patient has a normal affect.  DATA:  Ultrasound today shows maximal diameter 5.7 cm of his infrarenal aorta.  His study 6 months ago was 5.4 and 1 year ago was 5.6 cm.  MEDICAL ISSUES: Stable aneurysm size.  I did discuss with the patient is known arteriomegaly.  CT scan from approximately 4 years ago showed his native aorta to be 4 cm throughout the course with some dilatation of the infrarenal segment which is been stable for years.  I again explained that he is not a stent graft candidate and even if so would probably recommend against treatment even though his maximal size is now 5.7.  Explained that this is certainly not comparable to a standard aorta size versus his arteriomegaly.  We will see him again in 6 months for continued follow-up    Rosetta Posner, MD [facility] Tel [phone] Pager [phone]

## 2018-01-27 ENCOUNTER — Other Ambulatory Visit: Payer: Self-pay

## 2018-01-27 DIAGNOSIS — I714 Abdominal aortic aneurysm, without rupture, unspecified: Secondary | ICD-10-CM

## 2018-02-20 ENCOUNTER — Other Ambulatory Visit: Payer: Self-pay | Admitting: Physician Assistant

## 2018-02-20 DIAGNOSIS — N320 Bladder-neck obstruction: Secondary | ICD-10-CM

## 2018-07-08 ENCOUNTER — Ambulatory Visit: Payer: Self-pay | Admitting: Vascular Surgery

## 2018-07-08 ENCOUNTER — Other Ambulatory Visit (HOSPITAL_COMMUNITY): Payer: Self-pay

## 2018-09-02 ENCOUNTER — Encounter: Payer: Self-pay | Admitting: Physician Assistant

## 2018-09-16 ENCOUNTER — Other Ambulatory Visit: Payer: Self-pay | Admitting: Adult Health

## 2018-09-16 DIAGNOSIS — N320 Bladder-neck obstruction: Secondary | ICD-10-CM

## 2018-09-22 ENCOUNTER — Encounter: Payer: Self-pay | Admitting: Internal Medicine

## 2019-10-28 ENCOUNTER — Encounter: Payer: MEDICARE | Admitting: Internal Medicine

## 2023-04-12 DEATH — deceased
